# Patient Record
Sex: Female | Born: 1935 | Race: White | Hispanic: No | Marital: Married
Health system: Southern US, Community
[De-identification: ages and names within clinical notes are randomized; demographics above are authoritative.]

---

## 1997-08-15 ENCOUNTER — Inpatient Hospital Stay (HOSPITAL_COMMUNITY): Admission: RE | Admit: 1997-08-15 | Discharge: 1997-08-17 | Payer: Self-pay | Admitting: Orthopedic Surgery

## 1999-07-09 ENCOUNTER — Other Ambulatory Visit: Admission: RE | Admit: 1999-07-09 | Discharge: 1999-07-09 | Payer: Self-pay | Admitting: Cardiology

## 1999-12-24 ENCOUNTER — Ambulatory Visit (HOSPITAL_COMMUNITY): Admission: RE | Admit: 1999-12-24 | Discharge: 1999-12-24 | Payer: Self-pay | Admitting: Gastroenterology

## 2001-04-15 ENCOUNTER — Ambulatory Visit (HOSPITAL_COMMUNITY): Admission: RE | Admit: 2001-04-15 | Discharge: 2001-04-15 | Payer: Self-pay | Admitting: Specialist

## 2001-05-31 ENCOUNTER — Ambulatory Visit (HOSPITAL_COMMUNITY): Admission: RE | Admit: 2001-05-31 | Discharge: 2001-05-31 | Payer: Self-pay | Admitting: Specialist

## 2003-09-27 ENCOUNTER — Encounter: Admission: RE | Admit: 2003-09-27 | Discharge: 2003-11-28 | Payer: Self-pay | Admitting: Cardiology

## 2007-05-07 ENCOUNTER — Inpatient Hospital Stay (HOSPITAL_COMMUNITY): Admission: RE | Admit: 2007-05-07 | Discharge: 2007-05-11 | Payer: Self-pay | Admitting: Orthopedic Surgery

## 2008-02-29 ENCOUNTER — Encounter: Admission: RE | Admit: 2008-02-29 | Discharge: 2008-02-29 | Payer: Self-pay | Admitting: Orthopedic Surgery

## 2008-03-03 ENCOUNTER — Ambulatory Visit (HOSPITAL_COMMUNITY): Admission: RE | Admit: 2008-03-03 | Discharge: 2008-03-03 | Payer: Self-pay | Admitting: Orthopedic Surgery

## 2008-04-07 ENCOUNTER — Ambulatory Visit (HOSPITAL_COMMUNITY): Admission: RE | Admit: 2008-04-07 | Discharge: 2008-04-08 | Payer: Self-pay | Admitting: Orthopedic Surgery

## 2008-05-19 ENCOUNTER — Ambulatory Visit (HOSPITAL_COMMUNITY): Admission: RE | Admit: 2008-05-19 | Discharge: 2008-05-20 | Payer: Self-pay | Admitting: Orthopedic Surgery

## 2009-10-12 ENCOUNTER — Ambulatory Visit: Payer: Self-pay | Admitting: Cardiology

## 2009-10-16 ENCOUNTER — Ambulatory Visit: Payer: Self-pay | Admitting: Cardiology

## 2009-11-08 ENCOUNTER — Ambulatory Visit: Payer: Self-pay | Admitting: Cardiology

## 2010-04-11 ENCOUNTER — Other Ambulatory Visit (INDEPENDENT_AMBULATORY_CARE_PROVIDER_SITE_OTHER): Payer: Medicare Other

## 2010-04-11 DIAGNOSIS — Z79899 Other long term (current) drug therapy: Secondary | ICD-10-CM

## 2010-04-16 ENCOUNTER — Ambulatory Visit (INDEPENDENT_AMBULATORY_CARE_PROVIDER_SITE_OTHER): Payer: Medicare Other | Admitting: Cardiology

## 2010-04-16 DIAGNOSIS — I119 Hypertensive heart disease without heart failure: Secondary | ICD-10-CM

## 2010-04-16 DIAGNOSIS — E032 Hypothyroidism due to medicaments and other exogenous substances: Secondary | ICD-10-CM

## 2010-04-16 DIAGNOSIS — E782 Mixed hyperlipidemia: Secondary | ICD-10-CM

## 2010-05-15 LAB — CBC
HCT: 39.8 % (ref 36.0–46.0)
Hemoglobin: 11.7 g/dL — ABNORMAL LOW (ref 12.0–15.0)
Hemoglobin: 13.9 g/dL (ref 12.0–15.0)
RBC: 3.58 MIL/uL — ABNORMAL LOW (ref 3.87–5.11)
RBC: 4.3 MIL/uL (ref 3.87–5.11)
RDW: 13.5 % (ref 11.5–15.5)
RDW: 13.4 % (ref 11.5–15.5)

## 2010-05-15 LAB — ANAEROBIC CULTURE: Gram Stain: NONE SEEN

## 2010-05-15 LAB — URINALYSIS, ROUTINE W REFLEX MICROSCOPIC
Glucose, UA: NEGATIVE mg/dL
Specific Gravity, Urine: 1.007 (ref 1.005–1.030)
pH: 6 (ref 5.0–8.0)

## 2010-05-15 LAB — BASIC METABOLIC PANEL
Calcium: 9 mg/dL (ref 8.4–10.5)
GFR calc Af Amer: >60 mL/min (ref >60)
GFR calc non Af Amer: >60 mL/min (ref >60)
GFR calc non Af Amer: >60 mL/min (ref >60)
Glucose, Bld: 97 mg/dL (ref 70–99)
Potassium: 3.7 mEq/L (ref 3.5–5.1)
Sodium: 134 mEq/L — ABNORMAL LOW (ref 135–145)
Sodium: 134 mEq/L — ABNORMAL LOW (ref 135–145)

## 2010-05-15 LAB — TYPE AND SCREEN
ABO/RH(D): A POS
Antibody Screen: NEGATIVE

## 2010-05-15 LAB — TISSUE CULTURE: Culture: NO GROWTH

## 2010-05-15 LAB — DIFFERENTIAL
Basophils Absolute: 0 10*3/uL (ref 0.0–0.1)
Eosinophils Relative: 2 % (ref 0–5)
Lymphocytes Relative: 19 % (ref 12–46)
Lymphs Abs: 1.2 10*3/uL (ref 0.7–4.0)
Monocytes Absolute: 0.4 10*3/uL (ref 0.1–1.0)
Monocytes Relative: 6 % (ref 3–12)

## 2010-05-15 LAB — ABO/RH: ABO/RH(D): A POS

## 2010-05-16 LAB — BASIC METABOLIC PANEL
BUN: 15 mg/dL (ref 6–23)
CO2: 26 mEq/L (ref 19–32)
Calcium: 8.8 mg/dL (ref 8.4–10.5)
Chloride: 104 mEq/L (ref 96–112)
Creatinine, Ser: 0.72 mg/dL (ref 0.4–1.2)
GFR calc Af Amer: >60 mL/min (ref >60)
GFR calc non Af Amer: >60 mL/min (ref >60)
Glucose, Bld: 156 mg/dL — ABNORMAL HIGH (ref 70–99)
Glucose, Bld: 163 mg/dL — ABNORMAL HIGH (ref 70–99)
Potassium: 3.8 mEq/L (ref 3.5–5.1)
Sodium: 137 mEq/L (ref 135–145)

## 2010-05-16 LAB — DIFFERENTIAL
Basophils Relative: 0 % (ref 0–1)
Eosinophils Relative: 3 % (ref 0–5)
Monocytes Absolute: 0.3 10*3/uL (ref 0.1–1.0)
Monocytes Relative: 7 % (ref 3–12)
Neutro Abs: 2.9 10*3/uL (ref 1.7–7.7)
Neutrophils Relative %: 64 % (ref 43–77)

## 2010-05-16 LAB — URINALYSIS, ROUTINE W REFLEX MICROSCOPIC
Bilirubin Urine: NEGATIVE
Hgb urine dipstick: NEGATIVE
Specific Gravity, Urine: 1.011 (ref 1.005–1.030)
Urobilinogen, UA: 0.2 mg/dL (ref 0.0–1.0)
pH: 6.5 (ref 5.0–8.0)

## 2010-05-16 LAB — CBC
HCT: 34.5 % — ABNORMAL LOW (ref 36.0–46.0)
Hemoglobin: 11.6 g/dL — ABNORMAL LOW (ref 12.0–15.0)
MCHC: 33.6 g/dL (ref 30.0–36.0)
Platelets: 233 10*3/uL (ref 150–400)
RBC: 3.63 MIL/uL — ABNORMAL LOW (ref 3.87–5.11)
RDW: 13.2 % (ref 11.5–15.5)
RDW: 13.6 % (ref 11.5–15.5)
WBC: 4.6 10*3/uL (ref 4.0–10.5)

## 2010-05-16 LAB — PROTIME-INR: Prothrombin Time: 12.9 seconds (ref 11.6–15.2)

## 2010-05-17 ENCOUNTER — Telehealth: Payer: Self-pay | Admitting: Cardiology

## 2010-06-03 ENCOUNTER — Other Ambulatory Visit: Payer: Self-pay | Admitting: *Deleted

## 2010-06-03 DIAGNOSIS — E039 Hypothyroidism, unspecified: Secondary | ICD-10-CM

## 2010-06-03 DIAGNOSIS — E7800 Pure hypercholesterolemia, unspecified: Secondary | ICD-10-CM

## 2010-06-10 ENCOUNTER — Telehealth: Payer: Self-pay | Admitting: Cardiology

## 2010-06-10 ENCOUNTER — Encounter (INDEPENDENT_AMBULATORY_CARE_PROVIDER_SITE_OTHER): Payer: Medicare Other

## 2010-06-10 DIAGNOSIS — M7989 Other specified soft tissue disorders: Secondary | ICD-10-CM

## 2010-06-10 DIAGNOSIS — M79609 Pain in unspecified limb: Secondary | ICD-10-CM

## 2010-06-10 NOTE — Telephone Encounter (Signed)
Pt has swollen knee and ankle she wants a meds to help with swelling please call

## 2010-06-10 NOTE — Telephone Encounter (Signed)
Patient just drove to beach and back.  Having left knee, leg, and ankle swelling.  Asked if any pain, stated she had pain behind her knee and tightness in her calf.  Scheduled doppler to rule out dvt today, ok per Lawson Fiscal.

## 2010-06-11 ENCOUNTER — Telehealth: Payer: Self-pay | Admitting: *Deleted

## 2010-06-11 NOTE — Telephone Encounter (Signed)
Received a call report yesterday on doppler and it was negative.  Advised patient to try support stockings, elevate legs, and avoid salt per Lindsay Gibson.  Call back if swelling continues.

## 2010-06-13 NOTE — Procedures (Unsigned)
DUPLEX DEEP VENOUS EXAM - LOWER EXTREMITY  INDICATION:  Left lower extremity pain and edema.  HISTORY:  Edema:  Yes. Trauma/Surgery:  Right knee replacement. Pain:  Yes. PE:  No. Previous DVT:  No. Anticoagulants:  Patient takes 1 baby aspirin a day. Other:  DUPLEX EXAM:               CFV   SFV   PopV  PTV    GSV               R  L  R  L  R  L  R   L  R  L Thrombosis    o  o     o     o      o     o Spontaneous   +  +     +     +      +     + Phasic        +  +     +     +      +     + Augmentation  +  +     +     +      +     + Compressible  +  +     +     +      +     + Competent     +  +     +     +      +     +  Legend:  + - yes  o - no  p - partial  D - decreased  IMPRESSION:    1. No evidence of deep or superficial thrombophlebitis identified in       the left lower extremity.    2. Hypoechoic area present in the left popliteal fossa which does not       appear vascular in nature.    3. Preliminary report given to [facility] @ [facility]  office.   _____________________________ Di Kindle Edilia Bo, M.D.  SH/MEDQ  D:  06/10/2010  T:  06/10/2010  Job:  [id number]

## 2010-06-18 ENCOUNTER — Other Ambulatory Visit (INDEPENDENT_AMBULATORY_CARE_PROVIDER_SITE_OTHER): Payer: Medicare Other | Admitting: *Deleted

## 2010-06-18 DIAGNOSIS — E7800 Pure hypercholesterolemia, unspecified: Secondary | ICD-10-CM

## 2010-06-18 DIAGNOSIS — E039 Hypothyroidism, unspecified: Secondary | ICD-10-CM

## 2010-06-18 LAB — TSH: TSH: 2.93 u[IU]/mL (ref 0.35–5.50)

## 2010-06-18 LAB — LIPID PANEL
Total CHOL/HDL Ratio: 3
Triglycerides: 57 mg/dL (ref 0.0–149.0)

## 2010-06-18 LAB — HEPATIC FUNCTION PANEL
Bilirubin, Direct: 0.1 mg/dL (ref 0.0–0.3)
Total Bilirubin: 0.8 mg/dL (ref 0.3–1.2)

## 2010-06-18 LAB — BASIC METABOLIC PANEL
CO2: 30 mEq/L (ref 19–32)
Calcium: 9.6 mg/dL (ref 8.4–10.5)
GFR: 97.6 mL/min (ref >60.00)
Potassium: 4.8 mEq/L (ref 3.5–5.1)
Sodium: 140 mEq/L (ref 135–145)

## 2010-06-18 LAB — LDL CHOLESTEROL, DIRECT: Direct LDL: 141.2 mg/dL

## 2010-06-18 NOTE — Op Note (Signed)
Lindsay Gibson, Lindsay Gibson                  ACCOUNT NO.:  [id number]   MEDICAL RECORD NO.:  [id number]          PATIENT TYPE:  INP   LOCATION:  NA                           FACILITY:  [facility]   PHYSICIAN:  Almedia Balls. Ranell Patrick, M.D. DATE OF BIRTH:  12-19-1924   DATE OF PROCEDURE:  05/07/2007  DATE OF DISCHARGE:                               OPERATIVE REPORT   PREOPERATIVE DIAGNOSIS:  Right knee end-stage arthritis.   POSTOPERATIVE DIAGNOSIS:  Right knee end-stage arthritis.   PROCEDURE PERFORMED:  Right total knee replacement using DePuy system.   ATTENDING SURGEON:  Almedia Balls. Ranell Patrick, M.D.   ASSISTANT:  Konrad Felix Dixon, [place]   ANESTHESIA:  General anesthesia plus interscalene block anesthesia was  used.   ESTIMATED BLOOD LOSS:  Minimal.   FLUID REPLACEMENT:  1200 mL of crystalloid.   INSTRUMENT COUNTS:  Correct.   COMPLICATIONS:  None.   FLUID REPLACEMENT:  2200 mL of crystalloid.   URINE OUTPUT:  325 mL.   INDICATIONS:  The patient is an 77 year old female with a history of  worsening right knee pain.  The patient has known arthritis and has  failed an extensive period of conservative management and desires  operative treatment at this point to restore function and eliminate  pain.  Informed consent was obtained.   DESCRIPTION OF PROCEDURE:  After an adequate level of anesthesia was  achieved the patient was positioned supine on the operating room table.  The right leg was correctly identified.  Nonsterile tourniquet was  placed on the proximal thigh.  The left leg was placed on the table and  was padded appropriately and secured.  After sterile prep and drape of  the right knee we elevated the knee and exsanguinated the leg using the  Esmarch bandage.  The tourniquet was elevated to 300 mmHg. A  longitudinal midline skin incision was created with the knee in flexion.  Dissection was carried sharply down through subcutaneous tissues. We  identified the parapatellar retinaculum.  We went ahead and divided that  medially performing an arthrotomy and then everting the patella and  exposing the distal femur.   We entered the distal femur with a step-cut drill and placed the  intramedullary distal femoral resection guide resecting 10 mm at 5  degree right setting.  At this point we went ahead and sized the femur  to a size 3.  We performed our anterior, posterior and chamfer cuts.  We  then removed the ACL, PCL and meniscal tissue, subluxed the tibia  anteriorly and then performed our tibial cut 90 degrees perpendicular to  the long axis of the tibia, 6 mm off the affected side laterally.   We then sized and prepared the tibia.  It was a size 3.  Went ahead at  this point and placed our trial components after completing our  preparation of the tibia and completing our box cut on the femur.  We  reduced the knee with first a 10 and then a 12.5 insert and had  excellent stability and full range of motion with the 12.5.  We then  resurfaced the patella going from a  23 mm thickness down to 15.  It was  a size 35 patella.  We made our lug holes and then placed our trial  button on and took the knee through a full range of motion.  Excellent  patellar tracking was noted and good instability. We thoroughly  irrigated the knee, removed the trial components, placed bone in the  distal femur to plug the end of the hole from the intramedullary guide.  We then using third generation cement technique cemented the components  into place, allowed the cement to harden, removed excess cement with  quarter inch curved osteotome. We removed the trial insert and then  placed the real 12.5 insert. We took the knee through a full range of  motion again. There was good soft tissue balance and normal tracking of  the patella.   We then closed the parapatellar arthrotomy with interrupted #1 PDS  suture in a figure-of-eight fashion followed by 2-0 Vicryl for  subcutaneous closure and  running 4-0 Monocryl for skin.  Steri-Strips  were applied followed by a sterile dressing.  The patient tolerated the  procedure well.      Almedia Balls. Ranell Patrick, M.D.  Electronically Signed     SRN/MEDQ  D:  05/07/2007  T:  05/07/2007  Job:  [id number]

## 2010-06-18 NOTE — Op Note (Signed)
NAMETRANESHA, LESSNER NO.:  [id number]   MEDICAL RECORD NO.:  [id number]          PATIENT TYPE:  INP   LOCATION:  5023                         FACILITY:  [facility]   PHYSICIAN:  Almedia Balls. Ranell Patrick, M.D. 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   PROCEDURE PERFORMED:  Right shoulder revision arthroplasty with bone  grafting of glenoid defect.   SURGEON:  Almedia Balls. Ranell Patrick, MD   ASSISTANT:  Donnie Coffin. Dixon, PA-C   ANESTHESIA:  General anesthesia plus interscalene block anesthesia was  used.   ESTIMATED BLOOD LOSS:  Minimal.   FLUID REPLACEMENT:  1500 mL of crystalloid.   URINE OUTPUT:  200 mL.   INSTRUMENT COUNT:  Correct.   COMPLICATIONS:  None.   Perioperative antibiotics were given.   INDICATIONS:  The patient is an 77 year old female with a history of a  total shoulder replacement done at [place] a decade ago.  The patient has  done fairly well until the last 6-9 months where she has had worsening  pain in the shoulder.  She presents with radiographic evidence of a  loose glenoid component.  I discussed this with the patient regarding  treatment options including continued conservative treatment versus  surgical management.  The patient elected to proceed with surgery to try  to relieve the pain and restore function to the shoulder.  Informed  consent was obtained.   DESCRIPTION OF THE PROCEDURE:  After an adequate level of anesthesia was  achieved, the patient was positioned in the modified beach-chair  position.  Right shoulder examined under anesthesia.  The patient did  have some stiffness in the shoulder, forward elevation limited about  100, abduction  70, external rotation about 30, internal rotation  posterior over to the abdomen.  After examining the shoulder, I went  ahead and sterilely prepped and draped the shoulder in the usual manner.  We approached the shoulder through the patient's prior deltopectoral  approach dissection down through subcutaneous tissues.  The  deltopectoral interval identified starting at the coracoid and extending  distally.  We freed up that interval and the subdeltoid interval using a  Cobb elevator and a Bovie.  We identified the pectoralis and the  interval between the pectoralis and the coracoid process in the  conjoined tendon, gently mobilized that tissue, placed our self-  retaining retractors and then mobilized the conjoined tendon off the  subscapularis.  We were careful to identify the axillary nerve and  protected during the procedure.  We then released the subscapularis.  This was quite a robust tendon and released it from the rotator interval  and roughly at the bicipital groove.  I replaced two #2 FiberWire  sutures in a modified ONEOK suture technique into the tendon.  We  then went ahead and released the soft  tissues off the humeral neck  progressively externally rotating until we could deliver the humeral  head.  We then removed the humeral head using a tuning fork type wedge.  There was extensive soft tissue scarring all around the subdeltoid  interval also around the subscapularis and up in the rotator interval  area.  We had all that freed up until it could move quite well.  Once we  had released the subscapularis, we released the capsular tap, and there  was quite a bit of synovitis in joint which we removed sharply.  With  the head off, there was a lot of soft tissue ingrowth under the head and  between the stem and the head.  Stem seemed to be well fixed.  We pushed  on that and was not moving at all.  At this point, we did a  circumferential release around the glenoid.  The  glenoid was loose, we  removed that and had cement still attached to the skin.  There was a  large bony defect present in the glenoid vault.  We used a curette to  curette out all the soft tissue.  This was a contained defect and we  used a bur to bur down some of the hard sclerotic bone.  We had the  entire glenoid vault bleeding at which point we took 20 mL of cancellous  allograft and mixed that with the patient's blood and impacted that into  the defect.  We used a glenoid impactor to impact and compact the  cancellous bone until it basically reconstituted the glenoid.  Once that  was filled and compact, we went ahead and placed a fresh 44 x 27 Biomet  Bio-Modular head in place and impacted that.  We reduced the shoulder.  We were happy with soft tissue balancing and then went ahead and  thoroughly irrigated and then closed the subscapularis back into its  anatomic position with two #2 FiberWire sutures in a figure-of-eight  fashion in the rotator interval and about 4-5 FiberWires including the  Mason-Allen sutures sewing back out laterally.  We took the shoulder  through a range of motion, no instability was detected.  We thoroughly  irrigated and closed the deltopectoral interval with interrupted  0  Vicryl followed by 2-0 Vicryl subcutaneous closure and 4-0 Monocryl for  skin.  Steri-Strips applied followed by sterile dressing.      Almedia Balls. Ranell Patrick, M.D.  Electronically Signed     SRN/MEDQ  D:  05/19/2008  T:  05/20/2008  Job:  [id number]

## 2010-06-18 NOTE — H&P (Signed)
NAMELUNDON, ROSIER                  ACCOUNT NO.:  [id number]   MEDICAL RECORD NO.:  [id number]          PATIENT TYPE:  INP   LOCATION:  NA                           FACILITY:  [facility]   PHYSICIAN:  Almedia Balls. Ranell Patrick, M.D. DATE OF BIRTH:  Aug 13, 1924   DATE OF ADMISSION:  DATE OF DISCHARGE:                              HISTORY & PHYSICAL   CHIEF COMPLAINT:  Right shoulder pain, status post failed total shoulder  arthroplasty.   HISTORY OF PRESENT ILLNESS:  The patient is an 77 year old female who  was complaining of worsening right shoulder pain, status post right  total shoulder arthroplasty.  Films were obtained showing that she does  have a loose glenoid component on that right shoulder that she is  elected to have a revision by Dr. Malon Kindle.   PAST MEDICAL HISTORY:  Hypertension, history of breast cancer with  mastectomy on the right.   FAMILY MEDICAL HISTORY:  Negative.   SOCIAL HISTORY:  The patient does not smoke or drink.  The patient of  Dr. Patty Sermons.   DRUG ALLERGIES:  None.   CURRENT MEDICATIONS:  1. Hyzaar 100/25 mg p.o. daily.  2. Actonel 1 tablet monthly.  3. Aspirin 81 mg p.o. daily.  4. Lipitor 20 mg p.o. daily.  5. Celebrex 200 mg daily.  6. Amlodipine 5 mg daily.  7. Norco 5/325, one to two tablets q.4-6 h. p.r.n. pain.   REVIEW OF SYSTEMS:  Pain to range of motion of the left shoulder.   PHYSICAL EXAMINATION:  VITAL SIGNS:  Pulse 68, respirations 18, blood  pressure 110/68.  GENERAL:  The patient is a healthy-appearing 77 year old female, in no  acute distress.  Pleasant mood and affect.  Oriented x3.  HEAD AND NECK:  Full range of motion without tenderness.  Neck shows  cranial nerves II through XII grossly intact.  CHEST:  Active breath sounds bilaterally.  No wheezes, rhonchi, or  rales.  ABDOMEN:  Nontender, nondistended.  EXTREMITIES:  Moderate tenderness with decreased range of motion of the  right shoulder.  Forward flexion at 80 degrees,  external rotation 20  degrees, internal rotation to abdomen.  Capillary refill less than 2  seconds.  NEUROVASCULAR:  Otherwise intact.   X-ray shows right shoulder arthroplasty using a Biomet arthroplasty  system with a loose glenoid component.   IMPRESSION:  Right shoulder arthroplasty with loose glenoid.   PLAN OF ACTION:  Right total shoulder revision by Dr. Malon Kindle.      Thomas B. Durwin Nora, P.A.      Almedia Balls. Ranell Patrick, M.D.  Electronically Signed    TBD/MEDQ  D:  05/10/2008  T:  05/11/2008  Job:  [id number]

## 2010-06-18 NOTE — H&P (Signed)
NAMERANDOLPH, PAONE                  ACCOUNT NO.:  [id number]   MEDICAL RECORD NO.:  [id number]          PATIENT TYPE:  INP   LOCATION:  NA                           FACILITY:  [facility]   PHYSICIAN:  Almedia Balls. Ranell Patrick, M.D. DATE OF BIRTH:  [birth year]   DATE OF ADMISSION:  DATE OF DISCHARGE:                              HISTORY & PHYSICAL   CHIEF COMPLAINTS:  Right knee pain.   HISTORY OF PRESENT ILLNESS:  Patient has been having worsening right  knee pain that has been refractory to conservative treatment.  Patient  has elected have a right total knee arthroplasty by Dr. Malon Kindle.   PAST MEDICAL HISTORY:  1. History of breast cancer and inability to have any type of blood      pressures or lab draws from the right arm due to a right      mastectomy.  2. History of hypertension.   FAMILY HISTORY:  Hypertension.   SOCIAL HISTORY:  Occasional alcohol use.  She does not smoke.  Patient  of Dr. Patty Sermons.  She is retired.   ALLERGIES:  None.   MEDICATIONS:  1. Aspirin 81 mg p.o. daily.  2. Multivitamin daily.  3. Lipitor 20 mg p.o. daily.  4. Hyzaar 100/25 p.o. daily.  5. Celebrex 200 mg p.o. daily.  6. Actonel 150 mg once a month.  7. Calcium D 500 mg b.i.d.   REVIEW OF SYSTEMS:  Pain with ambulation.   PHYSICAL EXAMINATION:  VITAL SIGNS:  pulse 80, respirations 18, blood  pressure 144/82.  GENERAL:  The patient is a healthy-appearing 77 year old female in no  acute distress.  Pleasant mood and affect, alert and oriented x3.  Cranial nerves II-XII grossly intact.  She has full range of motion of  cervical, thoracic and lumbar spines with no tenderness to palpation to  paraspinal muscle and spinous processes.  CHEST:  Active breath sounds bilaterally with no wheezes, rhonchi or  rales.  HEART:  Shows regular rate and rhythm.  No murmur.  ABDOMEN:  Nontender, nondistended with active bowel sounds.  PELVIS:  Stable.  EXTREMITIES:  Shows moderate tenderness to the right  knees, especially  in the medial joint line with range of motion.  NEUROVASCULAR:  She is intact.  No rashes or edema distally.   STUDIES:  X-rays show end-stage osteoarthritis to the right knee.   IMPRESSION:  End-stage osteoarthritis right knee.   PLAN:  Have a right total knee arthroplasty by Dr. Malon Kindle.      Thomas B. Durwin Nora, P.A.      Almedia Balls. Ranell Patrick, M.D.  Electronically Signed    TBD/MEDQ  D:  04/27/2007  T:  04/27/2007  Job:  [id number]

## 2010-06-18 NOTE — Op Note (Signed)
[name], Lindsay Gibson                  ACCOUNT NO.:  [id number]   MEDICAL RECORD NO.:  [id number]          PATIENT TYPE:  OIB   LOCATION:  1620                         FACILITY:  [facility]   PHYSICIAN:  Madlyn Frankel. Charlann Boxer, M.D.  DATE OF BIRTH:  1925-01-28   DATE OF PROCEDURE:  04/07/2008  DATE OF DISCHARGE:                               OPERATIVE REPORT   PREOPERATIVE DIAGNOSIS:  Right knee patella clinically in excessive  synovial and scar in the suprapatellar pouch region, resulting in  mechanical symptoms that resulted in symptomatic knee pain in an 77-year-  old female, otherwise successful total knee replacement.   POSTOPERATIVE DIAGNOSIS:  Right knee patella clinically in excessive  synovial and scar in the suprapatellar pouch region, resulting in  mechanical symptoms that resulted in symptomatic knee pain in an 54-year-  old female, otherwise successful total knee replacement.   PROCEDURE:  Open right knee synovectomy and scar debridement, otherwise  incision and drainage of the knee.  No polyethylene exchange.   SURGEON:  Madlyn Frankel. Charlann Boxer, M.D.   ASSISTANT:  Surgical tech.   ANESTHESIA:  General with placement of a preoperative femoral nerve  block.   FINDINGS:  Consistent with suprapatellar clunk with a particular large 1  x 2 cm area of the excessive synovium and scar just above the patella.  tracked otherwise through the trochlea .   SPECIMENS:  None.   COMPLICATIONS:  None.   DRAINS:  None.   TOURNIQUET TIME:  Thirty-three minutes at 250 mmHg.   INDICATIONS FOR PROCEDURE:  Lindsay Gibson is an 77 year old female referred  over for evaluation and symptomatic crepitation in the knee,  particularly coming from the flexed to extended position.  She had a  workup that was negative for infection or loosening.  I reviewed with  her the mechanical findings on exam and surgical recommendations.  The  risks of recurrence, infection due to repeated surgery within a year  after indexed  total knee replacement, were all discussed in addition to  the standard risks of the DVT and knee replacement issues.  Consent was  obtained for the benefit of relief of her symptoms.   PROCEDURE IN DETAIL:  Patient was brought to the operating theater.  Once adequate anesthesia, preoperative antibiotics, Ancef, administered,  patient was positioned supine.  The thigh tourniquet was placed on the  right thigh.  The right lower extremity was then pre-scrubbed, prepped  and draped in a sterile fashion.  A time-out was performed, identifying  the patient, the planned procedure, and extremely.  The leg was  exsanguinated.  The tourniquet elevated to 250 mmHg.  The patient's old  incision was incised, and the knee opened.  Supracapsular dissection was  carried out, identifying the tissue planes.  I then incised the joint  capsule through a medial arthrotomy.  Once the knee was opened, the  synovial fluid was a bit blood-tinged but was normal, otherwise no signs  of any purulence.  At this point, 2 Kocher clamps were placed along the  media and retinacular tissue and subsequently performed a synovectomy  along this medial gutter, using the #10 knife blade.  I then switched  the Kochers to the lateral retinacular tissue and the majority of the  quadriceps tendon and did the same dissection, removing all of the  synovium that was scarred in the suprapatellar region, which we noted to  be quite significant.  I then removed more synovium through the lateral  gutter and in the infrapatellar region.  Following this, adequate  debridement of synovium, we cauterized the synovial tissue.  I then  injected the knee with 0.25% Marcaine with epinephrine.  I then injected  the knee with about 500 cc of normal saline solution.  No Hemovac drain  was placed.  I then reapproximated the extensor mechanism with the knee  in flexion using #1 Vicryl.  The remaining wound was closed with 2-0  Vicryl and a 4-0  running Monocryl on the skin.  The skin was cleaned,  dried, and dressed sterilely after the tourniquet was let down at 33  minutes.  Steri-Strips were applied.  A bulky sterile wrap was applied.  Patient was brought to the recovery room in stable condition, extubated  in stable condition, tolerating the procedure well.      Madlyn Frankel Charlann Boxer, M.D.  Electronically Signed     MDO/MEDQ  D:  04/07/2008  T:  04/07/2008  Job:  [id number]

## 2010-06-21 NOTE — Discharge Summary (Signed)
Lindsay Gibson, Lindsay Gibson                  ACCOUNT NO.:  [id number]   MEDICAL RECORD NO.:  [id number]          PATIENT TYPE:  INP   LOCATION:  1512                         FACILITY:  [facility]   PHYSICIAN:  Almedia Balls. Ranell Patrick, M.D. DATE OF BIRTH:  [birth year]   DATE OF ADMISSION:  05/07/2007  DATE OF DISCHARGE:  05/11/2007                               DISCHARGE SUMMARY   ADMISSION DIAGNOSIS:  Right knee osteoarthritis, end-stage.   DISCHARGE DIAGNOSES:  Right knee osteoarthritis, end-stage, status post  total knee arthroplasty.   BRIEF HISTORY:  The patient is an 77 year old female with worsening  right knee pain.  The pain has been refractory to conservative  treatment.  The patient elected to have a total knee arthroplasty.   PROCEDURE:  The patient had a right total knee arthroplasty by Dr.  Malon Kindle on May 07, 2007.   ASSISTANT:  Standley Dakins PA-C.   Minimal blood loss.  No complications.   HOSPITAL COURSE:  The patient admitted on May 07, 2007, for the above-  stated procedure, which she tolerated well.  After adequate time in the  post anesthesia care unit, she was transferred up to [address].  The  patient complained of some moderate pain when she was ambulating in that  right knee on postop day #1, but otherwise was okay.  Her dressing was  intact.  Neurovascularly, she was intact.  Labs were within acceptable  limits.  No signs of cellulitis, erythema or infection on dressing  changes.  On postop day 3-4, home health was set up through [place].  She did have a slow rising INR, and this patient was discharged home on  Lovenox.  Also, she was instructed and her family was instructed on how  to perform Lovenox injections before leaving the hospital.  Overall, she  did quite well with physical therapy.   DISCHARGE/PLAN:  The patient will be discharged home on May 11, 2007.   CONDITION:  Stable.   DIET:  Regular.   DISCHARGE MEDICATIONS:  1. Lovenox 30 mg subcu b.i.d.  2.  Coumadin per pharmacy protocol.  3. Percocet 5/325 one-to-two tabs q.4-6 hours p.r.n. pain.  4. Robaxin 500 mg p.o. q.6 h.  5. Multivitamin.  6. Lipitor 20 mg p.o. daily.  7. Hyzaar 100/25 p.o. daily.  8. Actonel 150 mg once a month.  9. Calcium 500 mg b.i.d.  10.Celebrex 200 mg daily p.r.n.   FOLLOW UP:  The patient will follow back up with Dr. Malon Kindle in 2  weeks.      Thomas B. Durwin Nora, P.A.      Almedia Balls. Ranell Patrick, M.D.  Electronically Signed    TBD/MEDQ  D:  05/28/2007  T:  05/28/2007  Job:  [id number]

## 2010-06-21 NOTE — Consult Note (Signed)
NAME:  DENELLE, CAPURRO NO.:  [id number]   MEDICAL RECORD NO.:  [id number]           PATIENT TYPE:   LOCATION:                               FACILITY:  [facility]   PHYSICIAN:  Sheldon Silvan, M.D.      DATE OF BIRTH:  1924/07/30   DATE OF CONSULTATION:  07/18/2008  DATE OF DISCHARGE:                                 CONSULTATION   This is basically a note conforming a telephone conversation with the  patient that I had today.   The patient had sent a letter to my office requesting her medical  records for surgeries done on March 5 and April 16.  On further  research, the March 5, surgery was done at [facility],  however, the May 19, 2008, surgery was done here and I was the  Anesthesiologist.   She was riding to my office saying that she had had difficulty with a  sore spot in the roof of her mouth after intubation.  She had a  difficult airway that we discovered during the procedure and a glide  scope was used.  There was no trauma noted at that time.  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 Bradly Chris.   She was not overly concerned, but it is still being treated for this  with antibiotics.  She was grateful for the telephone call, and I will  follow up with her as needed in the future.      Sheldon Silvan, M.D.     DC/MEDQ  D:  07/18/2008  T:  07/18/2008  Job:  [id number]

## 2010-06-24 ENCOUNTER — Other Ambulatory Visit: Payer: Self-pay | Admitting: *Deleted

## 2010-06-24 ENCOUNTER — Telehealth: Payer: Self-pay | Admitting: *Deleted

## 2010-06-24 DIAGNOSIS — I10 Essential (primary) hypertension: Secondary | ICD-10-CM

## 2010-06-24 MED ORDER — AMLODIPINE BESYLATE 10 MG PO TABS: 10.0000 mg | ORAL_TABLET | Freq: Every day | ORAL | Status: AC

## 2010-06-24 NOTE — Telephone Encounter (Signed)
Refilled amlodipine by fax

## 2010-06-24 NOTE — Telephone Encounter (Signed)
Adv. Pt. Of lab results samples, given of crestor 10mg  to take 1/2 daily . Pt. Will call in August to sch. Lab ahead of time for appt in Sept.

## 2010-07-24 ENCOUNTER — Ambulatory Visit
Admission: RE | Admit: 2010-07-24 | Discharge: 2010-07-24 | Disposition: A | Discharge status: Home / Self Care | Payer: Medicare Other | Source: Ambulatory Visit | Attending: Orthopedic Surgery | Admitting: Orthopedic Surgery

## 2010-07-24 ENCOUNTER — Other Ambulatory Visit: Payer: Self-pay | Admitting: Orthopedic Surgery

## 2010-07-24 DIAGNOSIS — M25511 Pain in right shoulder: Secondary | ICD-10-CM

## 2010-08-13 ENCOUNTER — Ambulatory Visit (HOSPITAL_COMMUNITY): Payer: Medicare Other

## 2010-08-13 ENCOUNTER — Inpatient Hospital Stay (HOSPITAL_COMMUNITY)
Admission: RE | Admit: 2010-08-13 | Discharge: 2010-08-16 | DRG: 507 | Disposition: A | Discharge status: Home Health | Payer: Medicare Other | Source: Ambulatory Visit | Attending: Orthopedic Surgery | Admitting: Orthopedic Surgery

## 2010-08-13 DIAGNOSIS — Z96659 Presence of unspecified artificial knee joint: Secondary | ICD-10-CM

## 2010-08-13 DIAGNOSIS — A4901 Methicillin susceptible Staphylococcus aureus infection, unspecified site: Secondary | ICD-10-CM | POA: Diagnosis present

## 2010-08-13 DIAGNOSIS — I10 Essential (primary) hypertension: Secondary | ICD-10-CM | POA: Diagnosis present

## 2010-08-13 DIAGNOSIS — IMO0002 Reserved for concepts with insufficient information to code with codable children: Secondary | ICD-10-CM | POA: Diagnosis present

## 2010-08-13 DIAGNOSIS — D649 Anemia, unspecified: Secondary | ICD-10-CM | POA: Diagnosis not present

## 2010-08-13 DIAGNOSIS — E039 Hypothyroidism, unspecified: Secondary | ICD-10-CM | POA: Diagnosis present

## 2010-08-13 DIAGNOSIS — Z7982 Long term (current) use of aspirin: Secondary | ICD-10-CM

## 2010-08-13 DIAGNOSIS — Z96619 Presence of unspecified artificial shoulder joint: Secondary | ICD-10-CM

## 2010-08-13 DIAGNOSIS — T8450XA Infection and inflammatory reaction due to unspecified internal joint prosthesis, initial encounter: Principal | ICD-10-CM | POA: Diagnosis present

## 2010-08-13 DIAGNOSIS — Z79899 Other long term (current) drug therapy: Secondary | ICD-10-CM

## 2010-08-13 DIAGNOSIS — Z853 Personal history of malignant neoplasm of breast: Secondary | ICD-10-CM

## 2010-08-13 DIAGNOSIS — Z01811 Encounter for preprocedural respiratory examination: Secondary | ICD-10-CM

## 2010-08-13 DIAGNOSIS — Y831 Surgical operation with implant of artificial internal device as the cause of abnormal reaction of the patient, or of later complication, without mention of misadventure at the time of the procedure: Secondary | ICD-10-CM | POA: Diagnosis present

## 2010-08-13 LAB — BASIC METABOLIC PANEL
Chloride: 98 mEq/L (ref 96–112)
GFR calc Af Amer: >60 mL/min (ref >60)
Potassium: 4.5 mEq/L (ref 3.5–5.1)
Sodium: 136 mEq/L (ref 135–145)

## 2010-08-13 LAB — PROTIME-INR: INR: 0.97 (ref 0.00–1.49)

## 2010-08-13 LAB — CBC
HCT: 39 % (ref 36.0–46.0)
Hemoglobin: 13.3 g/dL (ref 12.0–15.0)
MCV: 91.1 fL (ref 78.0–100.0)
WBC: 8 10*3/uL (ref 4.0–10.5)

## 2010-08-13 LAB — APTT: aPTT: 35 seconds (ref 24–37)

## 2010-08-13 LAB — SURGICAL PCR SCREEN
MRSA, PCR: NEGATIVE
Staphylococcus aureus: POSITIVE — AB

## 2010-08-13 LAB — GRAM STAIN

## 2010-08-14 ENCOUNTER — Inpatient Hospital Stay (HOSPITAL_COMMUNITY): Payer: Medicare Other

## 2010-08-14 DIAGNOSIS — T8450XA Infection and inflammatory reaction due to unspecified internal joint prosthesis, initial encounter: Secondary | ICD-10-CM

## 2010-08-14 DIAGNOSIS — Z96619 Presence of unspecified artificial shoulder joint: Secondary | ICD-10-CM

## 2010-08-14 DIAGNOSIS — Y831 Surgical operation with implant of artificial internal device as the cause of abnormal reaction of the patient, or of later complication, without mention of misadventure at the time of the procedure: Secondary | ICD-10-CM

## 2010-08-14 LAB — BASIC METABOLIC PANEL
CO2: 27 mEq/L (ref 19–32)
Calcium: 8.9 mg/dL (ref 8.4–10.5)
Creatinine, Ser: <0.47 mg/dL — ABNORMAL LOW (ref 0.50–1.10)

## 2010-08-14 LAB — CBC
MCH: 30.3 pg (ref 26.0–34.0)
MCV: 91.8 fL (ref 78.0–100.0)
Platelets: 302 10*3/uL (ref 150–400)
RBC: 3.66 MIL/uL — ABNORMAL LOW (ref 3.87–5.11)

## 2010-08-14 LAB — SEDIMENTATION RATE: Sed Rate: 75 mm/hr — ABNORMAL HIGH (ref 0–22)

## 2010-08-15 DIAGNOSIS — Z96619 Presence of unspecified artificial shoulder joint: Secondary | ICD-10-CM

## 2010-08-15 DIAGNOSIS — T8450XA Infection and inflammatory reaction due to unspecified internal joint prosthesis, initial encounter: Secondary | ICD-10-CM

## 2010-08-16 LAB — CULTURE, ROUTINE-ABSCESS

## 2010-08-18 LAB — ANAEROBIC CULTURE

## 2010-08-23 NOTE — Op Note (Signed)
NAMEEMMAJO, BENNETTE NO.:  [id number]  MEDICAL RECORD NO.:  [id number]  LOCATION:  5033                         FACILITY:  [facility]  PHYSICIAN:  Almedia Balls. Ranell Patrick, M.D. DATE OF BIRTH:  1924-06-05  DATE OF PROCEDURE:  08/13/2010 DATE OF DISCHARGE:                              OPERATIVE REPORT   PREOPERATIVE DIAGNOSIS:  Right shoulder infection.  POSTOPERATIVE DIAGNOSIS:  Right shoulder infection.  PROCEDURE PERFORMED:  Incision and drainage, deep right shoulder including placement of drain.  ATTENDING SURGEON:  Almedia Balls. Ranell Patrick, MD  ASSISTANT:  None.  ANESTHESIA:  General anesthesia was used.  ESTIMATED BLOOD LOSS:  100 mL.  FLUID REPLACEMENT:  1000 mL of crystalloid.  INSTRUMENT COUNTS:  Correct.  COMPLICATIONS:  There were no complications.  Perioperative antibiotics were given after cultures were obtained.  INDICATIONS:  The patient is an 77 year old female who is status post multiple surgeries to her right shoulder including prior total shoulder arthroplasty.  The patient has also gone onto develop a loose glenoid component several years ago requiring removal of the glenoid component and bone grafting of her shoulder.  The patient did well following that surgery and recently was seen after a fall to the shoulder, basically landed on her shoulder jamming her elbow up into her shoulder. Complaining of severe pain in the shoulder after that fall.  There was no evidence of any infection at that time.  The patient did report no problems with the shoulder before this fall.  She was referred for a CT arthrogram for the shoulder to rule out rotator cuff tear. Unfortunately over the last 72 hours, the patient has developed redness and warmth and pain to the shoulder.  She presented to Orthopedics today with concern for infection with an apparent abscess over the anterior incision.  She is brought here for I and D.  Informed consent  was obtained.  DESCRIPTION OF PROCEDURE:  After adequate local anesthesia was achieved, the patient was positioned in modified beach-chair position.  Right shoulder was sterilely prepped and draped in the usual manner.  We incised the patient's prior incision.  We encountered some cloudy fluid. I was able to take my finger and briskly probe down right into the anterior shoulder joint.  I did remove some suture material.  We did obtain cultures, stat Gram stain, aerobic, anaerobic cultures.  Gram stain came back positive for gram-positive cocci in clusters and pairs concerning for staph.  We then removed all foreign body material that we can find basically anterior suture material.  I was able to open up in the joint and then used a rongeur to clean out any tissue that appeared to be granulation tissue.  We irrigated with 3 liters of pulsatile irrigation.  We got back nice bleeding edges of tissue and closed the tissue with a full-thickness layer with a nylon mattress sutures, and over a drain, I placed a medium Hemovac in the joint.  We are going to wait for final cultures here, placed her on IV antibiotics, so we will get a PICC line placed and then our plan will follow based on cultures and response to IV antibiotics.  We  will also obtain an ID consult.  The patient was placed in a shoulder sling and taken to recovery room.     Almedia Balls. Ranell Patrick, M.D.     SRN/MEDQ  D:  08/13/2010  T:  08/14/2010  Job:  [id number]  Electronically Signed by Malon Kindle  on 08/23/2010 12:08:46 AM

## 2010-09-03 ENCOUNTER — Telehealth: Payer: Self-pay | Admitting: Cardiology

## 2010-09-03 NOTE — Telephone Encounter (Signed)
PT WOULD ONLY SAY HAS A FEW QUESTIONS, SHE IS AWARE THAT MELINDA IS NOT IN THE OFFICE.

## 2010-09-03 NOTE — Telephone Encounter (Signed)
Pt is requesting permission for surgery from Dr Patty Sermons. Scheduled for Aug 17th. Also asking if Dr Patty Sermons could recommend a neurologist in [place]. Driving to [facility] to see Dr Hollice Espy is too far. Note to be sent to Dr Patty Sermons then will advise.

## 2010-09-04 NOTE — Telephone Encounter (Signed)
In terms of his dementia problem we can refer him to [facility] neurology here in [place].  In terms of his shoulder surgery I would like to review his old chart

## 2010-09-09 ENCOUNTER — Encounter (HOSPITAL_COMMUNITY)
Admission: RE | Admit: 2010-09-09 | Discharge: 2010-09-09 | Disposition: A | Discharge status: Home / Self Care | Payer: Medicare Other | Source: Ambulatory Visit | Attending: Orthopedic Surgery | Admitting: Orthopedic Surgery

## 2010-09-09 ENCOUNTER — Telehealth: Payer: Self-pay | Admitting: Cardiology

## 2010-09-09 LAB — DIFFERENTIAL
Basophils Relative: 0 % (ref 0–1)
Eosinophils Relative: 12 % — ABNORMAL HIGH (ref 0–5)
Lymphocytes Relative: 17 % (ref 12–46)
Monocytes Absolute: 0.5 10*3/uL (ref 0.1–1.0)
Monocytes Relative: 8 % (ref 3–12)
Neutro Abs: 4.3 10*3/uL (ref 1.7–7.7)

## 2010-09-09 LAB — CBC
HCT: 34.1 % — ABNORMAL LOW (ref 36.0–46.0)
Hemoglobin: 11.5 g/dL — ABNORMAL LOW (ref 12.0–15.0)
MCH: 30.3 pg (ref 26.0–34.0)
MCHC: 33.7 g/dL (ref 30.0–36.0)
RDW: 13.5 % (ref 11.5–15.5)

## 2010-09-09 LAB — URINALYSIS, ROUTINE W REFLEX MICROSCOPIC
Glucose, UA: NEGATIVE mg/dL
Hgb urine dipstick: NEGATIVE
Ketones, ur: 15 mg/dL — AB
Protein, ur: NEGATIVE mg/dL

## 2010-09-09 LAB — PROTIME-INR
INR: 1.02 (ref 0.00–1.49)
Prothrombin Time: 13.6 seconds (ref 11.6–15.2)

## 2010-09-09 LAB — SURGICAL PCR SCREEN: MRSA, PCR: NEGATIVE

## 2010-09-09 LAB — URINE MICROSCOPIC-ADD ON

## 2010-09-09 LAB — BASIC METABOLIC PANEL
CO2: 31 mEq/L (ref 19–32)
Calcium: 10.1 mg/dL (ref 8.4–10.5)
Creatinine, Ser: 0.5 mg/dL (ref 0.50–1.10)
GFR calc Af Amer: >60 mL/min (ref >60)
GFR calc non Af Amer: >60 mL/min (ref >60)

## 2010-09-09 LAB — APTT: aPTT: 38 seconds — ABNORMAL HIGH (ref 24–37)

## 2010-09-09 NOTE — Telephone Encounter (Signed)
Call Back Phone#: [phone] LAtest OV, EKG, ECHO, STRESS

## 2010-09-10 NOTE — Telephone Encounter (Signed)
Last OV Note, EKG, ECHO, Stress faxed to [phone] today

## 2010-09-20 ENCOUNTER — Inpatient Hospital Stay (HOSPITAL_COMMUNITY): Payer: Medicare Other

## 2010-09-20 ENCOUNTER — Inpatient Hospital Stay (HOSPITAL_COMMUNITY)
Admission: RE | Admit: 2010-09-20 | Discharge: 2010-10-05 | DRG: 495 | Disposition: E | Payer: Medicare Other | Source: Ambulatory Visit | Attending: Pulmonary Disease | Admitting: Pulmonary Disease

## 2010-09-20 DIAGNOSIS — A419 Sepsis, unspecified organism: Secondary | ICD-10-CM | POA: Diagnosis not present

## 2010-09-20 DIAGNOSIS — R0902 Hypoxemia: Secondary | ICD-10-CM | POA: Diagnosis not present

## 2010-09-20 DIAGNOSIS — T8450XA Infection and inflammatory reaction due to unspecified internal joint prosthesis, initial encounter: Secondary | ICD-10-CM

## 2010-09-20 DIAGNOSIS — D689 Coagulation defect, unspecified: Secondary | ICD-10-CM | POA: Diagnosis not present

## 2010-09-20 DIAGNOSIS — J189 Pneumonia, unspecified organism: Secondary | ICD-10-CM | POA: Diagnosis not present

## 2010-09-20 DIAGNOSIS — Z7982 Long term (current) use of aspirin: Secondary | ICD-10-CM

## 2010-09-20 DIAGNOSIS — I4892 Unspecified atrial flutter: Secondary | ICD-10-CM | POA: Diagnosis not present

## 2010-09-20 DIAGNOSIS — I509 Heart failure, unspecified: Secondary | ICD-10-CM | POA: Diagnosis not present

## 2010-09-20 DIAGNOSIS — Z923 Personal history of irradiation: Secondary | ICD-10-CM

## 2010-09-20 DIAGNOSIS — D696 Thrombocytopenia, unspecified: Secondary | ICD-10-CM | POA: Diagnosis not present

## 2010-09-20 DIAGNOSIS — Y831 Surgical operation with implant of artificial internal device as the cause of abnormal reaction of the patient, or of later complication, without mention of misadventure at the time of the procedure: Secondary | ICD-10-CM

## 2010-09-20 DIAGNOSIS — Z96619 Presence of unspecified artificial shoulder joint: Secondary | ICD-10-CM

## 2010-09-20 DIAGNOSIS — E871 Hypo-osmolality and hyponatremia: Secondary | ICD-10-CM | POA: Diagnosis not present

## 2010-09-20 DIAGNOSIS — E872 Acidosis, unspecified: Secondary | ICD-10-CM | POA: Diagnosis not present

## 2010-09-20 DIAGNOSIS — Z96659 Presence of unspecified artificial knee joint: Secondary | ICD-10-CM

## 2010-09-20 DIAGNOSIS — E039 Hypothyroidism, unspecified: Secondary | ICD-10-CM | POA: Diagnosis present

## 2010-09-20 DIAGNOSIS — G9341 Metabolic encephalopathy: Secondary | ICD-10-CM | POA: Diagnosis not present

## 2010-09-20 DIAGNOSIS — E7800 Pure hypercholesterolemia, unspecified: Secondary | ICD-10-CM | POA: Diagnosis present

## 2010-09-20 DIAGNOSIS — N170 Acute kidney failure with tubular necrosis: Secondary | ICD-10-CM | POA: Diagnosis not present

## 2010-09-20 DIAGNOSIS — IMO0002 Reserved for concepts with insufficient information to code with codable children: Secondary | ICD-10-CM | POA: Diagnosis present

## 2010-09-20 DIAGNOSIS — N390 Urinary tract infection, site not specified: Secondary | ICD-10-CM | POA: Diagnosis not present

## 2010-09-20 DIAGNOSIS — Z853 Personal history of malignant neoplasm of breast: Secondary | ICD-10-CM

## 2010-09-20 DIAGNOSIS — I10 Essential (primary) hypertension: Secondary | ICD-10-CM | POA: Diagnosis present

## 2010-09-20 DIAGNOSIS — J9600 Acute respiratory failure, unspecified whether with hypoxia or hypercapnia: Secondary | ICD-10-CM | POA: Diagnosis not present

## 2010-09-20 DIAGNOSIS — D6489 Other specified anemias: Secondary | ICD-10-CM | POA: Diagnosis not present

## 2010-09-20 DIAGNOSIS — Z9221 Personal history of antineoplastic chemotherapy: Secondary | ICD-10-CM

## 2010-09-20 DIAGNOSIS — Z9010 Acquired absence of unspecified breast and nipple: Secondary | ICD-10-CM

## 2010-09-20 DIAGNOSIS — E878 Other disorders of electrolyte and fluid balance, not elsewhere classified: Secondary | ICD-10-CM | POA: Diagnosis not present

## 2010-09-20 DIAGNOSIS — Z01812 Encounter for preprocedural laboratory examination: Secondary | ICD-10-CM

## 2010-09-20 DIAGNOSIS — R6520 Severe sepsis without septic shock: Secondary | ICD-10-CM | POA: Diagnosis not present

## 2010-09-20 DIAGNOSIS — I4891 Unspecified atrial fibrillation: Secondary | ICD-10-CM | POA: Diagnosis not present

## 2010-09-20 DIAGNOSIS — Z66 Do not resuscitate: Secondary | ICD-10-CM | POA: Diagnosis not present

## 2010-09-20 DIAGNOSIS — A4101 Sepsis due to Methicillin susceptible Staphylococcus aureus: Secondary | ICD-10-CM | POA: Diagnosis not present

## 2010-09-20 DIAGNOSIS — N008 Acute nephritic syndrome with other morphologic changes: Secondary | ICD-10-CM | POA: Diagnosis not present

## 2010-09-20 DIAGNOSIS — Y92009 Unspecified place in unspecified non-institutional (private) residence as the place of occurrence of the external cause: Secondary | ICD-10-CM

## 2010-09-20 DIAGNOSIS — Z515 Encounter for palliative care: Secondary | ICD-10-CM

## 2010-09-20 DIAGNOSIS — Z01810 Encounter for preprocedural cardiovascular examination: Secondary | ICD-10-CM

## 2010-09-20 LAB — GRAM STAIN

## 2010-09-20 LAB — TYPE AND SCREEN

## 2010-09-20 NOTE — Discharge Summary (Signed)
  Lindsay Gibson, Lindsay Gibson NO.:  [id number]  MEDICAL RECORD NO.:  [id number]  LOCATION:  5033                         FACILITY:  [facility]  PHYSICIAN:  Almedia Balls. Ranell Patrick, M.D. DATE OF BIRTH:  July 13, 1924  DATE OF ADMISSION:  08/13/2010 DATE OF DISCHARGE:  08/16/2010                              DISCHARGE SUMMARY   ADMISSION DIAGNOSIS:  Right shoulder infection.  DISCHARGE DIAGNOSIS:  Right shoulder infection.  BRIEF HISTORY:  The patient is an 77 year old female status post multiple surgeries to the right shoulder who just had a revision of a total shoulder arthroplasty to a hemiarthroplasty, who presented to the office with worsening redness and erythema to the right shoulder.  The patient was admitted for surgical management for an I and D and antibiotics for this ongoing infection.  PROCEDURE:  The patient had a right shoulder I and D and placement of a drain by Dr. Malon Kindle on August 13, 2010.  Assistant was none, general anesthesia was used, and no complications.  HOSPITAL COURSE:  The patient was admitted on August 13, 2010, for the above-stated procedure which she tolerated well.  After adequate time in postanesthesia care unit, she was transferred up to 5000.  On postop day 1, the patient was found to have some mild pain to the right shoulder, otherwise was doing fairly well.  We did have Dr. Orvan Falconer from Infectious Disease come and evaluate her for recommendations for antibiotics.  However, cultures and Gram stains during her hospital stay did show staph that was sensitive to vancomycin and thus, she was placed on vancomycin with a PICC line for home use and with close followup. The patient tolerated hospital stay, otherwise her labs were within acceptable limits.  She was discharged home with her husband on August 16, 2010.  FOLLOWUP:  The patient will follow back up with Dr. Malon Kindle in 1 week and also follow up with Dr. Orvan Falconer in about 5  weeks.  DISCHARGE MEDICATIONS: 1. Robaxin 500 mg p.o. q.6 h. 2. Percocet 5/325 one to two tablets q.6 h. p.r.n. pain. 3. Vancomycin through her PICC line as directed by pharmacy.  CONDITION:  Stable.     Thomas B. Dixon, P.A.   ______________________________ Almedia Balls. Ranell Patrick, M.D.    TBD/MEDQ  D:  09/04/2010  T:  09/05/2010  Job:  [id number]  Electronically Signed by Standley Dakins P.A. on 09/10/2010 02:27:59 PM Electronically Signed by Malon Kindle  on 01-Oct-2010 10:22:08 AM

## 2010-09-21 ENCOUNTER — Inpatient Hospital Stay (HOSPITAL_COMMUNITY): Payer: Medicare Other

## 2010-09-21 LAB — DIFFERENTIAL
Band Neutrophils: 0 % (ref 0–10)
Basophils Absolute: 0 10*3/uL (ref 0.0–0.1)
Basophils Relative: 0 % (ref 0–1)
Metamyelocytes Relative: 0 %
Myelocytes: 0 %
Neutrophils Relative %: 78 % — ABNORMAL HIGH (ref 43–77)
Promyelocytes Absolute: 0 %
WBC Morphology: INCREASED

## 2010-09-21 LAB — BASIC METABOLIC PANEL
CO2: 26 mEq/L (ref 19–32)
Calcium: 7.1 mg/dL — ABNORMAL LOW (ref 8.4–10.5)
Creatinine, Ser: 1.48 mg/dL — ABNORMAL HIGH (ref 0.50–1.10)
Glucose, Bld: 101 mg/dL — ABNORMAL HIGH (ref 70–99)

## 2010-09-21 LAB — CBC
Hemoglobin: 9.1 g/dL — ABNORMAL LOW (ref 12.0–15.0)
RBC: 3.1 MIL/uL — ABNORMAL LOW (ref 3.87–5.11)
WBC: 12.4 10*3/uL — ABNORMAL HIGH (ref 4.0–10.5)

## 2010-09-21 LAB — C-REACTIVE PROTEIN: CRP: 17.5 mg/dL — ABNORMAL HIGH (ref <0.6)

## 2010-09-21 LAB — SEDIMENTATION RATE: Sed Rate: 50 mm/hr — ABNORMAL HIGH (ref 0–22)

## 2010-09-21 NOTE — Op Note (Signed)
NAMELOUIS, GAW NO.:  [id number]  MEDICAL RECORD NO.:  [id number]  LOCATION:  5032                         FACILITY:  [facility]  PHYSICIAN:  Almedia Balls. Ranell Patrick, M.D. DATE OF BIRTH:  08-29-1924  DATE OF PROCEDURE:  09/08/2010 DATE OF DISCHARGE:                              OPERATIVE REPORT   PREOPERATIVE DIAGNOSIS:  Right shoulder hemiarthroplasty with infection.  POSTOPERATIVE DIAGNOSIS:  Right shoulder hemiarthroplasty with infection.  PROCEDURE PERFORMED:  Right shoulder hemiarthroplasty removal (implant removal deep), irrigation and debridement of right shoulder and placement of antibiotic impregnated spacer by [employer].  SURGEON:  Almedia Balls. Ranell Patrick, MD  ASSISTANT:  Konrad Felix Dixon, PA-C  ANESTHESIA:  General anesthesia was used plus interscalene block.  ESTIMATED BLOOD LOSS:  200 mL.  FLUID REPLACEMENT:  1200 mL of crystalloid.  SPECIMEN:  Cultures were sent x2, one was fluid and second was some deep soft tissue.  COUNTS:  Instrument count was correct.  COMPLICATIONS:  There were no complications.  ANTIBIOTICS:  Perioperative antibiotics were given.  INDICATIONS:  The patient is an 77 year old female who suffered a failure for total shoulder replacement several years ago.  The patient presented for removal of her loose glenoid back in 2010.  The patient did had bone grafting of her glenoid defect and went on to do very well with her hemiarthroplasty.  We just replaced her humeral head and left her humeral stem in place.  The patient did well until several months ago when she began having redness and swelling consistent with an infection.  We did a limited I and D recently and the patient subsequently developed little draining sinus and ongoing infection.  She is brought back now for implant removal and placement of antibiotic- impregnated spacer.  Risks and benefits of the procedure were discussed with the patient.  The patient elected to  proceed with surgery. Informed consent was obtained.  DESCRIPTION OF PROCEDURE:  After adequate level of anesthesia was achieved, the patient was positioned in a modified beach-chair position. Right shoulder was sterilely prepped and draped in the usual manner. The patient's deltopectoral incision was utilized with #10 blade scalpel.  We did go and ellipsed out the area of skin that had the sinus on it immediately encountering the deepest interarticular space.  There was cloudy fluid that was expressed.  We cultured that for aerobic, anaerobic, and Gram stain.  We then went ahead and released the soft tissues off the humeral shaft laterally and then traced that up over the top of the humeral head component.  This was a English as a second language teacher.  We also released off the medial humeral neck and progressively externally rotated.  We then went ahead and removed the humeral head with a wedge and tuning fork and then we went ahead and basically knocked out the component retrograde and had a fairly thick cement mantle which we removed using a combination of osteotomes and cement removal equipment.  Once we had canal free of cement, we irrigated with 6 L of pulse irrigation with normal saline.  We also removed the entire pseudocapsule from the joint.  The rotator cuff had been sacrificed with the upper  part of the tuberosity.  There was very little rotator cuff left, mostly posterior structures and the tuberosity fragment and as we were removing cement this was just removed understanding the patient would only be a candidate for reverse shoulder arthroplasty anyway should we be able to successfully eradicate her infection.  With all the cement out of her shoulder and a nice canal distally, we pulse irrigated and then went ahead and cemented in the [employer] PROSTALAC or prosthesis that is made of polymethylmethacrylate impregnated with gentamicin.  This was the 11-mm stemmed version with  a 46 head I believe and we cemented that in slight retroversion.  At this point after the cement had hardened, we reduced the shoulder and then closed the deltopectoral interval with a #1 PDS suture followed by 2-0 nylon interrupted skin closure, sterile compressive bandage, and shoulder sling.  The patient tolerated the surgery well and was taken to the recovery room.     Almedia Balls. Ranell Patrick, M.D.     SRN/MEDQ  D:  10/01/2010  T:  09/07/2010  Job:  [id number]  Electronically Signed by Malon Kindle  on 09/21/2010 11:38:12 PM

## 2010-09-22 ENCOUNTER — Inpatient Hospital Stay (HOSPITAL_COMMUNITY): Payer: Medicare Other

## 2010-09-22 LAB — BASIC METABOLIC PANEL
BUN: 24 mg/dL — ABNORMAL HIGH (ref 6–23)
BUN: 30 mg/dL — ABNORMAL HIGH (ref 6–23)
CO2: 25 mEq/L (ref 19–32)
CO2: 22 mEq/L (ref 19–32)
Calcium: 6.4 mg/dL — CL (ref 8.4–10.5)
Chloride: 86 mEq/L — ABNORMAL LOW (ref 96–112)
Chloride: 90 mEq/L — ABNORMAL LOW (ref 96–112)
Creatinine, Ser: 2.04 mg/dL — ABNORMAL HIGH (ref 0.50–1.10)
Creatinine, Ser: 2.36 mg/dL — ABNORMAL HIGH (ref 0.50–1.10)
Creatinine, Ser: 3.24 mg/dL — ABNORMAL HIGH (ref 0.50–1.10)
GFR calc Af Amer: 17 mL/min — ABNORMAL LOW (ref >60)
GFR calc non Af Amer: 14 mL/min — ABNORMAL LOW (ref >60)
Potassium: 3.8 mEq/L (ref 3.5–5.1)
Sodium: 120 mEq/L — ABNORMAL LOW (ref 135–145)

## 2010-09-22 LAB — COMPREHENSIVE METABOLIC PANEL
ALT: <5 U/L (ref 0–35)
CO2: 21 mEq/L (ref 19–32)
Calcium: 6.4 mg/dL — CL (ref 8.4–10.5)
GFR calc Af Amer: 23 mL/min — ABNORMAL LOW (ref >60)
GFR calc non Af Amer: 19 mL/min — ABNORMAL LOW (ref >60)
Glucose, Bld: 87 mg/dL (ref 70–99)
Sodium: 121 mEq/L — ABNORMAL LOW (ref 135–145)

## 2010-09-22 LAB — CBC
HCT: 27.2 % — ABNORMAL LOW (ref 36.0–46.0)
HCT: 27.7 % — ABNORMAL LOW (ref 36.0–46.0)
Hemoglobin: 9.2 g/dL — ABNORMAL LOW (ref 12.0–15.0)
MCH: 29.4 pg (ref 26.0–34.0)
MCHC: 34.3 g/dL (ref 30.0–36.0)
MCV: 87 fL (ref 78.0–100.0)
MCV: 86.9 fL (ref 78.0–100.0)
MCV: 85.8 fL (ref 78.0–100.0)
Platelets: 191 10*3/uL (ref 150–400)
Platelets: 197 10*3/uL (ref 150–400)
RBC: 3.15 MIL/uL — ABNORMAL LOW (ref 3.87–5.11)
RBC: 3.13 MIL/uL — ABNORMAL LOW (ref 3.87–5.11)
RDW: 14 % (ref 11.5–15.5)
WBC: 14.4 10*3/uL — ABNORMAL HIGH (ref 4.0–10.5)
WBC: 16.1 10*3/uL — ABNORMAL HIGH (ref 4.0–10.5)

## 2010-09-22 LAB — URINALYSIS, ROUTINE W REFLEX MICROSCOPIC
Glucose, UA: NEGATIVE mg/dL
pH: 5 (ref 5.0–8.0)

## 2010-09-22 LAB — GENTAMICIN LEVEL, RANDOM: Gentamicin Rm: 0.3 ug/mL

## 2010-09-22 LAB — DIFFERENTIAL
Eosinophils Absolute: 2.1 10*3/uL — ABNORMAL HIGH (ref 0.0–0.7)
Lymphs Abs: 0.4 10*3/uL — ABNORMAL LOW (ref 0.7–4.0)
Neutro Abs: 11.5 10*3/uL — ABNORMAL HIGH (ref 1.7–7.7)
Neutrophils Relative %: 80 % — ABNORMAL HIGH (ref 43–77)

## 2010-09-22 LAB — URINE MICROSCOPIC-ADD ON

## 2010-09-23 ENCOUNTER — Inpatient Hospital Stay (HOSPITAL_COMMUNITY): Payer: Medicare Other

## 2010-09-23 DIAGNOSIS — A419 Sepsis, unspecified organism: Secondary | ICD-10-CM

## 2010-09-23 DIAGNOSIS — R6521 Severe sepsis with septic shock: Secondary | ICD-10-CM

## 2010-09-23 DIAGNOSIS — R6520 Severe sepsis without septic shock: Secondary | ICD-10-CM

## 2010-09-23 DIAGNOSIS — N170 Acute kidney failure with tubular necrosis: Secondary | ICD-10-CM

## 2010-09-23 DIAGNOSIS — R7881 Bacteremia: Secondary | ICD-10-CM

## 2010-09-23 DIAGNOSIS — R0902 Hypoxemia: Secondary | ICD-10-CM

## 2010-09-23 DIAGNOSIS — I369 Nonrheumatic tricuspid valve disorder, unspecified: Secondary | ICD-10-CM

## 2010-09-23 LAB — BLOOD GAS, ARTERIAL
Acid-base deficit: 7.1 mmol/L — ABNORMAL HIGH (ref 0.0–2.0)
Bicarbonate: 17.9 mEq/L — ABNORMAL LOW (ref 20.0–24.0)
Patient temperature: 97.8
TCO2: 19 mmol/L (ref 0–100)
pH, Arterial: 7.323 — ABNORMAL LOW (ref 7.350–7.400)

## 2010-09-23 LAB — HEPATIC FUNCTION PANEL
ALT: <5 U/L (ref 0–35)
AST: 11 U/L (ref 0–37)
Albumin: 1.2 g/dL — ABNORMAL LOW (ref 3.5–5.2)
Alkaline Phosphatase: 242 U/L — ABNORMAL HIGH (ref 39–117)
Total Protein: 3.8 g/dL — ABNORMAL LOW (ref 6.0–8.3)

## 2010-09-23 LAB — DIFFERENTIAL
Band Neutrophils: 25 % — ABNORMAL HIGH (ref 0–10)
Blasts: 0 %
Lymphocytes Relative: 1 % — ABNORMAL LOW (ref 12–46)
Lymphs Abs: 0.2 10*3/uL — ABNORMAL LOW (ref 0.7–4.0)
Metamyelocytes Relative: 0 %
Promyelocytes Absolute: 0 %
WBC Morphology: INCREASED
nRBC: 0 /100 WBC

## 2010-09-23 LAB — URINE MICROSCOPIC-ADD ON

## 2010-09-23 LAB — CBC
Hemoglobin: 10.1 g/dL — ABNORMAL LOW (ref 12.0–15.0)
RBC: 3.47 MIL/uL — ABNORMAL LOW (ref 3.87–5.11)

## 2010-09-23 LAB — CARDIAC PANEL(CRET KIN+CKTOT+MB+TROPI)
CK, MB: 2.1 ng/mL (ref 0.3–4.0)
CK, MB: 2.9 ng/mL (ref 0.3–4.0)
CK, MB: 3.1 ng/mL (ref 0.3–4.0)
Relative Index: INVALID (ref 0.0–2.5)
Relative Index: INVALID (ref 0.0–2.5)
Relative Index: INVALID (ref 0.0–2.5)
Total CK: 21 U/L (ref 7–177)
Total CK: 21 U/L (ref 7–177)
Troponin I: <0.3 ng/mL (ref <0.30)
Troponin I: <0.3 ng/mL (ref <0.30)

## 2010-09-23 LAB — URINALYSIS, ROUTINE W REFLEX MICROSCOPIC
Glucose, UA: NEGATIVE mg/dL
pH: 5 (ref 5.0–8.0)

## 2010-09-23 LAB — URINE CULTURE
Colony Count: NO GROWTH
Culture: NO GROWTH

## 2010-09-23 LAB — BODY FLUID CULTURE

## 2010-09-23 LAB — TSH: TSH: 0.818 u[IU]/mL (ref 0.350–4.500)

## 2010-09-23 LAB — PROTIME-INR
Prothrombin Time: 86.7 seconds — ABNORMAL HIGH (ref 11.6–15.2)
Prothrombin Time: 27.5 seconds — ABNORMAL HIGH (ref 11.6–15.2)

## 2010-09-23 LAB — TISSUE CULTURE

## 2010-09-24 ENCOUNTER — Inpatient Hospital Stay (HOSPITAL_COMMUNITY): Payer: Medicare Other

## 2010-09-24 DIAGNOSIS — N170 Acute kidney failure with tubular necrosis: Secondary | ICD-10-CM

## 2010-09-24 DIAGNOSIS — R6521 Severe sepsis with septic shock: Secondary | ICD-10-CM

## 2010-09-24 DIAGNOSIS — R6520 Severe sepsis without septic shock: Secondary | ICD-10-CM

## 2010-09-24 DIAGNOSIS — A419 Sepsis, unspecified organism: Secondary | ICD-10-CM

## 2010-09-24 LAB — BLOOD GAS, ARTERIAL
Bicarbonate: 18 mEq/L — ABNORMAL LOW (ref 20.0–24.0)
TCO2: 19.2 mmol/L (ref 0–100)
pCO2 arterial: 40.4 mmHg (ref 35.0–45.0)
pH, Arterial: 7.266 — ABNORMAL LOW (ref 7.350–7.400)
pO2, Arterial: 65.8 mmHg — ABNORMAL LOW (ref 80.0–100.0)

## 2010-09-24 LAB — PREPARE FRESH FROZEN PLASMA
Unit division: 0
Unit division: 0

## 2010-09-24 LAB — RENAL FUNCTION PANEL
CO2: 20 mEq/L (ref 19–32)
Calcium: 6.5 mg/dL — ABNORMAL LOW (ref 8.4–10.5)
Creatinine, Ser: 3.82 mg/dL — ABNORMAL HIGH (ref 0.50–1.10)
GFR calc non Af Amer: 11 mL/min — ABNORMAL LOW (ref >60)
Glucose, Bld: 215 mg/dL — ABNORMAL HIGH (ref 70–99)
Phosphorus: 1.5 mg/dL — ABNORMAL LOW (ref 2.3–4.6)

## 2010-09-24 LAB — CBC
HCT: 24.1 % — ABNORMAL LOW (ref 36.0–46.0)
Hemoglobin: 8.2 g/dL — ABNORMAL LOW (ref 12.0–15.0)
MCV: 84.9 fL (ref 78.0–100.0)
RBC: 2.84 MIL/uL — ABNORMAL LOW (ref 3.87–5.11)
RDW: 14.1 % (ref 11.5–15.5)
WBC: 16.4 10*3/uL — ABNORMAL HIGH (ref 4.0–10.5)

## 2010-09-24 LAB — COMPREHENSIVE METABOLIC PANEL
ALT: <5 U/L (ref 0–35)
AST: 23 U/L (ref 0–37)
Albumin: 1.6 g/dL — ABNORMAL LOW (ref 3.5–5.2)
CO2: 17 mEq/L — ABNORMAL LOW (ref 19–32)
Chloride: 95 mEq/L — ABNORMAL LOW (ref 96–112)
Creatinine, Ser: 3.79 mg/dL — ABNORMAL HIGH (ref 0.50–1.10)
Potassium: 3.4 mEq/L — ABNORMAL LOW (ref 3.5–5.1)
Sodium: 125 mEq/L — ABNORMAL LOW (ref 135–145)
Total Bilirubin: 4.1 mg/dL — ABNORMAL HIGH (ref 0.3–1.2)

## 2010-09-24 LAB — PHOSPHORUS: Phosphorus: 2.4 mg/dL (ref 2.3–4.6)

## 2010-09-24 LAB — DIC (DISSEMINATED INTRAVASCULAR COAGULATION)PANEL
D-Dimer, Quant: 1.91 ug/mL-FEU — ABNORMAL HIGH (ref 0.00–0.48)
Fibrinogen: 352 mg/dL (ref 204–475)
Smear Review: NONE SEEN
aPTT: 66 seconds — ABNORMAL HIGH (ref 24–37)

## 2010-09-24 LAB — TYPE AND SCREEN: Antibody Screen: NEGATIVE

## 2010-09-25 ENCOUNTER — Inpatient Hospital Stay (HOSPITAL_COMMUNITY): Payer: Medicare Other

## 2010-09-25 DIAGNOSIS — M7989 Other specified soft tissue disorders: Secondary | ICD-10-CM

## 2010-09-25 LAB — CBC
HCT: 27.9 % — ABNORMAL LOW (ref 36.0–46.0)
Hemoglobin: 9.6 g/dL — ABNORMAL LOW (ref 12.0–15.0)
MCH: 28.8 pg (ref 26.0–34.0)
MCHC: 34.4 g/dL (ref 30.0–36.0)

## 2010-09-25 LAB — POCT I-STAT 3, ART BLOOD GAS (G3+)
Acid-base deficit: 6 mmol/L — ABNORMAL HIGH (ref 0.0–2.0)
O2 Saturation: 53 %
Patient temperature: 99.4
pO2, Arterial: 33 mmHg — CL (ref 80.0–100.0)

## 2010-09-25 LAB — RENAL FUNCTION PANEL
BUN: 24 mg/dL — ABNORMAL HIGH (ref 6–23)
CO2: 23 mEq/L (ref 19–32)
Chloride: 97 mEq/L (ref 96–112)
Creatinine, Ser: 2.78 mg/dL — ABNORMAL HIGH (ref 0.50–1.10)
Glucose, Bld: 117 mg/dL — ABNORMAL HIGH (ref 70–99)

## 2010-09-25 LAB — LACTIC ACID, PLASMA: Lactic Acid, Venous: 2.3 mmol/L — ABNORMAL HIGH (ref 0.5–2.2)

## 2010-09-26 ENCOUNTER — Inpatient Hospital Stay (HOSPITAL_COMMUNITY): Payer: Medicare Other

## 2010-09-26 LAB — RENAL FUNCTION PANEL
CO2: 13 mEq/L — ABNORMAL LOW (ref 19–32)
Calcium: 6.6 mg/dL — ABNORMAL LOW (ref 8.4–10.5)
Creatinine, Ser: 1.5 mg/dL — ABNORMAL HIGH (ref 0.50–1.10)
Glucose, Bld: 136 mg/dL — ABNORMAL HIGH (ref 70–99)

## 2010-09-26 LAB — CATH TIP CULTURE: Culture: NO GROWTH

## 2010-09-26 LAB — CBC
Hemoglobin: 8.7 g/dL — ABNORMAL LOW (ref 12.0–15.0)
MCH: 29.4 pg (ref 26.0–34.0)
MCHC: 34.3 g/dL (ref 30.0–36.0)

## 2010-09-26 LAB — BLOOD GAS, ARTERIAL
Acid-base deficit: 7.4 mmol/L — ABNORMAL HIGH (ref 0.0–2.0)
Drawn by: 155271
Patient temperature: 98.6
TCO2: 19.4 mmol/L (ref 0–100)
pCO2 arterial: 40.4 mmHg (ref 35.0–45.0)
pH, Arterial: 7.276 — ABNORMAL LOW (ref 7.350–7.400)

## 2010-09-26 LAB — COMPREHENSIVE METABOLIC PANEL
CO2: 20 mEq/L (ref 19–32)
Calcium: 6.4 mg/dL — CL (ref 8.4–10.5)
Creatinine, Ser: 2.01 mg/dL — ABNORMAL HIGH (ref 0.50–1.10)
GFR calc Af Amer: 29 mL/min — ABNORMAL LOW (ref >60)
GFR calc non Af Amer: 24 mL/min — ABNORMAL LOW (ref >60)
Glucose, Bld: 203 mg/dL — ABNORMAL HIGH (ref 70–99)
Sodium: 131 mEq/L — ABNORMAL LOW (ref 135–145)
Total Protein: 3.9 g/dL — ABNORMAL LOW (ref 6.0–8.3)

## 2010-09-26 LAB — PHOSPHORUS: Phosphorus: 2.4 mg/dL (ref 2.3–4.6)

## 2010-09-26 LAB — PROTIME-INR: Prothrombin Time: 21.7 seconds — ABNORMAL HIGH (ref 11.6–15.2)

## 2010-09-26 LAB — MAGNESIUM: Magnesium: 2 mg/dL (ref 1.5–2.5)

## 2010-09-27 ENCOUNTER — Inpatient Hospital Stay (HOSPITAL_COMMUNITY): Payer: Medicare Other

## 2010-09-27 LAB — POCT I-STAT 3, ART BLOOD GAS (G3+)
O2 Saturation: 88 %
TCO2: 14 mmol/L (ref 0–100)
pCO2 arterial: 42.3 mmHg (ref 35.0–45.0)
pO2, Arterial: 73 mmHg — ABNORMAL LOW (ref 80.0–100.0)

## 2010-09-28 LAB — CULTURE, BLOOD (SINGLE): Culture  Setup Time: 201208191127

## 2010-09-28 LAB — CULTURE, BLOOD (ROUTINE X 2): Culture: NO GROWTH

## 2010-09-30 ENCOUNTER — Other Ambulatory Visit: Payer: Self-pay | Admitting: Orthopedic Surgery

## 2010-10-01 LAB — CULTURE, BLOOD (ROUTINE X 2)
Culture  Setup Time: 201208222233
Culture: NO GROWTH

## 2010-10-05 LAB — ANAEROBIC CULTURE

## 2010-10-05 DEATH — deceased

## 2010-10-26 NOTE — Discharge Summary (Signed)
Lindsay Gibson, FOOS NO.:  [id number]  MEDICAL RECORD NO.:  [id number]  LOCATION:  2905                         FACILITY:  [facility]  PHYSICIAN:  Leslye Peer, MD    DATE OF BIRTH:  Nov 08, 1924  DATE OF ADMISSION:  09/06/2010 DATE OF DISCHARGE:  10-06-2010                              DISCHARGE SUMMARY   DEATH SUMMARY  DATE OF DEATH:  10/06/10.  FINAL CAUSE OF DEATH:  Shock presumed to be septic shock.  SECONDARY CAUSES OF DEATH:  Included 1. Acute renal failure requiring renal replacement therapy. 2. 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 3. Metabolic encephalopathy. 4. Acute lung injury with acute respiratory failure. 5. Atrial fibrillation with rapid ventricular response. 6. Thrombocytopenia and possible sepsis related coagulopathy.  HOSPITAL COURSE:  Ms. Kohlman is a delightful 77 year old woman with history of hypertension and a right shoulder arthroplasty that was performed in the 1990s.  She subsequently had repeat surgeries with a failed arthroplasty in 2010 and a revision to a hemiarthroplasty to follow.  This was complicated by suspected right shoulder hardware infection, status post I and D in July 2012 that revealed a methicillin- sensitive staph aureus.  She had a drain placed and was started on vancomycin per a left-sided upper extremity PICC line.  She continued to have pain despite antibiotic therapy and plans were made for the right shoulder hemiarthroplasty to be removed on September 20, 2010.  She was admitted on that date and taken to the operating room by Dr. Ranell Patrick with Orthopedic Surgery for removal of her hemiarthroplasty, irrigation debridement and then the placement of a gentamicin impregnated spacer. In retrospect, she stated that she had not been feeling well in the week leading up to the procedure with some weakness and poor p.o. intake. These symptoms continued after the  procedure and she was noted to have oliguria that progressed to anuria with some lethargy and an increased oxygen need.  Her serum creatinine and sodium revealed acute renal failure and hyponatremia 2 days postoperatively and she was evaluated by Internal Medicine and received IV fluid resuscitation.  Her antibiotics at that time included cefazolin postoperatively and this was broadened to Zosyn given concern that possibly she has an alternative infectious source be on her known MSSA.  She was moved to the ICU for further evaluation and care.  Unfortunately, on September 22, 2010, she developed new atrial fibrillation with rapid ventricular response and associated shock.  She was loaded with amiodarone at that time, but was continued to require pressors.  She underwent DC cardioversion on the evening of August 20.  She was evaluated by Nephrology and she continued to have worsening oliguric renal failure despite the initiation of high-dose Lasix, for this reason hemodialysis catheter was placed and continuous hemodialysis was initiated.  Infectious Disease consultation was appreciated and her antibiotics were adjusted with Zosyn discontinued on August 20 due to the possibilities of crossover with Ancef with suspected acute interstitial nephritis and a contribution to her renal failure.  She was treated with vancomycin and Levaquin on starting on August 20 and then on  August 22 the vancomycin was stopped and was changed to tigecycline.  All of her shoulder culture data was negative as were her blood cultures.  Her regional PICC line was removed on August 20 and a second PICC line was placed which was a new stick site, the left PICC line culture did not grow out any organisms.  She continued to remain lethargic, confused, requiring phenylephrine support and her chest x-ray showed bilateral infiltrates that were thought to be consistent with either pulmonary edema from her renal failure or  an acute lung injury.  She continued to have shock and was started on stress dose steroids.  Her amiodarone was discontinued and her pressors were transitioned to norepinephrine.  It was clear that despite all aggressive interventions to manage her shock, probable infection and renal failure that Ms. Noori was not improving.  Discussions were undertaken with her family regarding whether she would want to benefit from mechanical ventilation if she developed over respiratory failure. Family took this information into advisement and plans were made to continue to discuss this issue as she progressed, the etiology of her renal failure was not clear but we were concerned there was a component of a AIN due to her initial outpatient antibiotic regimen and also potentially her gentamicin impregnated right shoulder prosthesis.  There was no documented shock state prior to the evolution of her acute renal failure.  Despite all continued aggressive interventions including continuous hemodialysis, Ms. Woodrow's mental status and respiratory status continued to decline.  On the evening of August 23 and early morning August 24 it was clear that she was developing respiratory failure both to pulmonary infiltrates and her altered mental status. The family decided not to escalate her care further, her mechanical ventilation was deferred, her therapy was transitioned to comfort care and she expired at 1:40 on September 27, 2010.     Leslye Peer, MD     RSB/MEDQ  D:  10/16/2010  T:  10/16/2010  Job:  [id number]  Electronically Signed by Levy Pupa MD on 10/26/2010 12:33:25 PM

## 2010-10-27 NOTE — Consult Note (Signed)
Lindsay Gibson, PHARR NO.:  [id number]  MEDICAL RECORD NO.:  [id number]  LOCATION:  5032                         FACILITY:  [facility]  PHYSICIAN:  Acey Lav, MD  DATE OF BIRTH:  [birth year]  DATE OF CONSULTATION: DATE OF DISCHARGE:                                CONSULTATION   REQUESTING PHYSICIAN:  Almedia Balls. 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  HISTORY OF PRESENT ILLNESS:  This is an 77 year old Caucasian female with a past medical history significant for right shoulder arthroplasty in the 1990s who developed a right shoulder failed arthroplasty in 2010 and underwent revision arthroplasty in 2010 with bone grafting.  The patient unfortunately developed a methicillin-sensitive staph aureus infection and underwent I and D of the deep right shoulder with placement of a drain on August 14, 2010.  She was discharged to home on vancomycin.  Unfortunately, she continued to develop pain in the shoulder site and concerning for persistent infection.  She was brought into the hospital today and underwent I and D and removal of prosthetic material placement of antibiotic spacer.  Intraoperative cultures were taken with gram stains not showing any organisms.  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  PAST MEDICAL HISTORY: 1. Hypertension. 2. History of breast cancer status post mastectomy.  Shoulder     replacement as described above with infection. 3. Right knee synovectomy and scar debridement and I and D of the knee     on April 07, 2008. 4. Right total knee replacement in March 2009.  FAMILY HISTORY:  Noncontributory.  SOCIAL HISTORY:  Occasional alcohol use.  No smoking.  ALLERGIES:  None.  CURRENT MEDICATIONS:  In the hospital include amlodipine,  aspirin, cefazolin 1 gram q.8, docusate, hydrochlorothiazide, levothyroxine, Cozaar, Robaxin, morphine sulfate, rifampin, Crestor, Benadryl, Percocet, Narcan, Zofran, Ambien.  REVIEW OF SYSTEMS:  As described in the history of present illness, otherwise 12-point review of systems negative.  PHYSICAL EXAMINATION:  VITAL SIGNS:  Temperature is 98.3, blood pressure 118/57, pulse 76, respirations 18, pulse ox 94% on nasal cannula. GENERAL:  Pleasant lady in no acute stress. HEENT:  Normocephalic, atraumatic.  Pupils are equal, round, and reactive to light.  Sclerae anicteric.  Oropharynx clear. CARDIOVASCULAR:  Regular rate and rhythm.  No murmurs, gallops, or rubs heard. LUNGS:  Clear to auscultation bilaterally. ABDOMEN:  Soft, nondistended. EXTREMITIES:  Without edema.  Right shoulder with brace in place.  LABORATORY DATA:  Gram stain from the 17th, no organisms seen on Ancef.  Further microbiological data August 13, 2010, abundant methicillin- sensitive staph aureus resistant to clindamycin, resistant to erythromycin, sensitive to gentamicin, levofloxacin, oxacillin, rifampin, Bactrim, tetracycline, moxifloxacin and vancomycin.  C- reactive protein on 11th was 6.8, sed rate was 75.  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  I would continue cefazolin.  Based on her renal function, I feel that she should be able to  get 2 g IV q.8 h.  I will increase the dose to this.  I would give her 8 weeks of IV cefazolin.  If she has any prosthetic material left, I would also continue the rifampin that she is currently on at 600 mg a day.  We are happy to see her in followup in the Infectious Disease Clinic.  My partner Dr. Maurice March is on the weekend and we will follow up her cultures.  I will be back on Monday and  follow up with the patient if she is still in the hospital at that time. Otherwise please call [phone] to make a followup appointment for this patient as she nears the completion of her antibiotic course.  I will also check a sed rate, C-reactive protein.     Acey Lav, MD     CV/MEDQ  D:  09/24/2010  T:  09/12/2010  Job:  [id number]  Electronically Signed by Paulette Blanch DAM MD on 10/27/2010 09:58:44 PM

## 2010-10-28 LAB — URINALYSIS, ROUTINE W REFLEX MICROSCOPIC
Ketones, ur: NEGATIVE
Nitrite: NEGATIVE
Protein, ur: NEGATIVE
Urobilinogen, UA: 0.2
pH: 7

## 2010-10-28 LAB — DIFFERENTIAL
Basophils Relative: 1
Lymphs Abs: 1.3
Monocytes Absolute: 0.4
Monocytes Relative: 6
Neutro Abs: 4.8

## 2010-10-28 LAB — BASIC METABOLIC PANEL
CO2: 34 — ABNORMAL HIGH
Calcium: 10.1
Chloride: 102
GFR calc Af Amer: >60
Sodium: 142

## 2010-10-28 LAB — CBC
Hemoglobin: 14.1
MCHC: 35.2
RBC: 4.33

## 2010-10-28 LAB — TYPE AND SCREEN
ABO/RH(D): A POS
Antibody Screen: NEGATIVE

## 2010-10-28 LAB — APTT: aPTT: 20 — ABNORMAL LOW

## 2010-10-29 LAB — CBC
Hemoglobin: 10.1 — ABNORMAL LOW
MCV: 92.4
Platelets: 176
Platelets: 187
RBC: 3.09 — ABNORMAL LOW
RBC: 3.2 — ABNORMAL LOW
RDW: 13.1
RDW: 13.1
WBC: 7.1
WBC: 7.6

## 2010-10-29 LAB — BASIC METABOLIC PANEL
BUN: 6
BUN: 7
Calcium: 7.9 — ABNORMAL LOW
Calcium: 8 — ABNORMAL LOW
Chloride: 103
Creatinine, Ser: 0.54
Creatinine, Ser: 0.61
Creatinine, Ser: 0.68
GFR calc Af Amer: >60
GFR calc Af Amer: >60
GFR calc non Af Amer: >60
GFR calc non Af Amer: >60
GFR calc non Af Amer: >60
Glucose, Bld: 162 — ABNORMAL HIGH
Sodium: 129 — ABNORMAL LOW

## 2010-10-29 LAB — PROTIME-INR
INR: 1.2
INR: 1.3
Prothrombin Time: 13.2
Prothrombin Time: 15.8 — ABNORMAL HIGH
Prothrombin Time: 16.4 — ABNORMAL HIGH

## 2010-11-04 DEATH — deceased

## 2010-11-06 NOTE — Consult Note (Signed)
Lindsay Gibson, [place] NO.:  [id number]  MEDICAL RECORD NO.:  [id number]  LOCATION:  5032                         FACILITY:  [facility]  PHYSICIAN:  Carlota Raspberry, MD         DATE OF BIRTH:  Oct 06, 1924  DATE OF CONSULTATION: DATE OF DISCHARGE:                                CONSULTATION   REASON FOR CONSULTATION:  Low urine output, hypoxia, malaise, status post right shoulder orthopedic procedure.  REQUESTING PHYSICIAN:  Almedia Balls. Ranell Patrick, MD from Orthopedics.  CHIEF COMPLAINT:  Abdominal distention and regurgitation.  HISTORY OF PRESENT ILLNESS:  This is an 77 year old female with a history of hypertension and right shoulder arthroplasty in the 1990s with failed arthroplasty in 2010 and revision arthroplasty in 2010 who for the past several weeks is being followed by Orthopedics for suspected right shoulder hardware infection.  She underwent I and D of the right shoulder with placement of a drain in July 2012 and was discharge home on vancomycin via left-sided PICC.  She continued to develop pain and was taken to the hospital on 18-Oct-2010, where she underwent a right shoulder hemiarthroplasty removal with irrigation and debridement of the right shoulder and placement of a gentamicin- impregnated spacer.  Per conversation with Dr. Ranell Patrick, she looked well and was doing well postoperatively; however, through Saturday her urine output began to decrease and she was bolused 500 mL and was receiving 70 mL/hour of normal saline; however, despite this through Saturday she only produced about 50 mL of urine through the whole day.  Further complicating this, she started being getting a little more hypoxic with reported desats to 90% on 3 L and by the time Medicine was consulted chest x-ray was concerning for bilateral multifocal opacities concerning for pneumonia.  EKG was also done and was not concerning for any ischemic changes and is unchanged compared to prior per my  read, however, she also appears to be developing a rise in white count from 12- 14 and her sodium is declining as well from 123-120, and she is going into acute renal failure with a creatinine rising from 1.48 to 2.04, therefore Internal Medicine was consulted.  In discussion with the patient, she feels general weakness that is nonfocal.  She also complains of some nausea and some regurgitation of whatever she tries to take down.  She is having abdominal pain in her bilateral upper quadrants across just underneath her ribs.  She denies any bilateral lower quadrant pain.  She has decreased appetite and she feels feverish.  Of note, she states that as early as last Tuesday, she was feeling generally well and was up and about doing her day-to-day activities, but then all of this malaise and feeling ill started on Wednesday just before she was admitted on Friday.  She does deny shortness of breath, chest pain, headaches, vision changes, swelling.  She denies any erythema or redness or tenderness in her PICC insertion site and states that it has been okay for the past 4 to 5 weeks with no signs of infection.  REVIEW OF SYSTEMS:  As above, otherwise negative.  PAST MEDICAL HISTORY: 1. Hypertension. 2.  Breast cancer status post mastectomy 35 years ago.  She also     received chemo and radiation. 3. Shoulder replacement in the 1990s complicated by right shoulder     arthroplasty in 2010 and revision arthroplasty in 2010.  Also     status post I and D of the deep right shoulder with cultures     showing MSSA. 4. Right knee synovectomy and scar debridement and I and D of the knee     on April 22, 2008. 5. Total right knee replacement in March 2009.  ALLERGIES:  No known drug allergies.  HOME MEDICATIONS:  Rifampin, Crestor, methocarbamol, Vicodin, multivitamin, ibuprofen, levothyroxine, Hyzaar, aspirin 81, and amlodipine 10.  MEDICINES RECEIVED IN THE HOSPITAL SINCE ADMISSION:   Amlodipine, aspirin, cefazolin, docusate, levothyroxine, methocarbamol, morphine, multivitamin, rifampin, Crestor, Tylenol, P.r.n. Benadryl, p.r.n. Zofran.  FAMILY HISTORY:  Noncontributory.  SOCIAL HISTORY:  She lives at [facility] with her husband and just moved about 2 weeks ago.  She has 6 children.  She has a sister and husband.  She is a never smoker.  She drinks occasional glass of wine at nighttime, but has not done that for the past several weeks.  PHYSICAL EXAMINATION:  VITAL SIGNS:  Temperature is 99.7 with T-max through her admission is 100.0 yesterday, blood pressure is 118/68, has been in the 110-120s, pulse 98, oxygen saturation was 93% on 2 L; however, when I took her off oxygen she desatted into the mid 80s. GENERAL:  She is an average-sized woman lying in her hospital bed.  She appears flush and her cheeks are red.  She is conversant and alert and oriented.  She appears mildly uncomfortable, but is not in any acute distress. HEENT:  Pupils are equal, round, and reactive.  Anicteric.  Her sclerae were fairly clear.  Her mouth was very dry appearing on her mucous membranes. LUNGS:  Had minimal crackles at her bilateral bases, but were not grossly wet sounding, but her air movement was not great. HEART:  Regular rate and rhythm with a very soft systolic murmur. ABDOMEN:  Distended, but not tight or tense.  She was tender in her bilateral upper quadrants and her epigastrium, but she did not exhibit gross peritoneal signs. EXTREMITIES:  Her bilateral lower extremities were warm and without any edema.  They appeared to be well perfused.  She had pneumatic compression devices on.  Examination of her right shoulder surgery site appeared well.  We examined this with Dr. Ranell Patrick and it did not appear erythematosus or purulent or tender. NEUROLOGIC:  She was intact.  She was alert, oriented, and conversant. She was moving all her extremities spontaneously  with no gross focal neurological deficits noted.  LABORATORY DATA:  Significant for a white count on arrival of 12.4 with greater than 20% bands.  Her white count is now rising to 14.4, her hematocrit is low at 27.4, but stable, platelets are normal.  Her ESR is high at 50.  Her sodium is declining from 123-120, potassium is 3.6, chloride is low as well at 86.  BUN and creatinine are 23 and 2.04.  Her bicarb is 25.  Glucose is 154.  Abdominal x-ray shows a benign bowel gas pattern.  Chest x-ray was concerning for patchy bilateral pulmonary infiltrates ? pneumonia.  EKG was reviewed by me and appears nonischemic.  ASSESSMENT/PLAN:  This is an 77 year old female status post right shoulder hemiarthroplasty with deep implant removal, irrigation and debridement of right shoulder, and placement of  a gentamicin spacer on 10/18/10, who now presents with hypoxia and bilateral pulmonary infiltrates, anuria/acute renal failure, abdominal pain and distention, leukocytosis with increased band forms, hyponatremia. 1. Hypoxia.  She was saturating in the low 90s on 2 L, but desats to     the 80s when taken off room air.  This in combination with     multifocal infiltrates is concerning for either healthcare-     associated pneumonia versus developing ARDS.  It does not look     quite like edema to me and I think at this point she is more dry     than wet, therefore we will broaden her cefazolin out to Zosyn and     given that she was growing out MSSA from her shoulder, I think the     suspicion for MRSA is lower and therefore we will hold off on     vancomycin for now; however, if she continues to decompensate, I     would add on vancomycin. 2. Leukocytosis with bandemia.  Clinically, she looks infected.  We     will broaden her out to Zosyn at this point with a low threshold to     broaden her further with vancomycin.  I have asked for blood     cultures x2, one from the PICC and one from  the peripheral and also     UA and new culture to evaluate for sources of infection, although     her belly does appear minimally distended and she may have a little     bit of a postop ileus versus involving bowel obstruction.  This may     be the source of her fevers as well.  I would have a low threshold     to scan her belly with IV and p.o. contrast should she continue to     further decompensate as well; however, given the multifocal     opacities being a more likely source of infection, we will hold off     on this for now. 3. Anuria/acute renal failure.  I have asked them to bolus her 1 L and     gave her 125 per hour for another 500 mL over the next couple hours     and also get a urine lytes and urine also so we can calculate out     her FENA, although clinically she looks dry and she is also     hyponatremic with hypochloremia and hopefully her kidney function     will pick up with more IV fluids; however, we would be cautious to     make sure that she does not have pulmonary edema.  I have asked     them to also hold losartan and hydrochlorothiazide until her kidney     function improves. 4. Hyponatremia.  Again I suspect that this is likely prerenal,     however, in the differential given her postoperative state and a     potential long process it will be an SIADH picture, but at this     point we will trend out her electrolytes after the administration     of normal saline. 5. Fluid, electrolytes, and nutrition.  We will give her IV fluids as     above.  She should have clear liquids for now and I will be     cautious with her in advancing her diet for now.  IV access, she  has a left-sided PICC in place. 6. Code status is presumed full, although this should be clarified.          ______________________________ Carlota Raspberry, MD     EB/MEDQ  D:  09/22/2010  T:  09/22/2010  Job:  [id number]  Electronically Signed by Carlota Raspberry MD on 11/06/2010 12:04:01 PM

## 2010-12-14 IMAGING — CT CT EXTREM UP W/ CM*L*
2 of 3 series · 7 of 14 positions shown, 8 images · IV contrast (agent unspecified)
Comparison: None

CLINICAL DATA: Right shoulder pain.  Status post total shoulder
arthroplasty.

CT OF THE RIGHT SHOULDER WITH INTRA-ARTICULAR CONTRAST
TECHNIQUE: Multidetector CT imaging of the right shoulder was
performed according to the standard protocol following intra-
articular contrast administration. Multiplanar CT image
reconstructions were also generated.
Contrast: 4 ml of Rudi 60 and diluted with 8 ml 1% lidocaine

[Series 4: shoulder/standard · axial · 0.47mm/px · z∈[-146,-68]mm · 4 of 104 slices shown]
[im 21/104  soft-tissue]
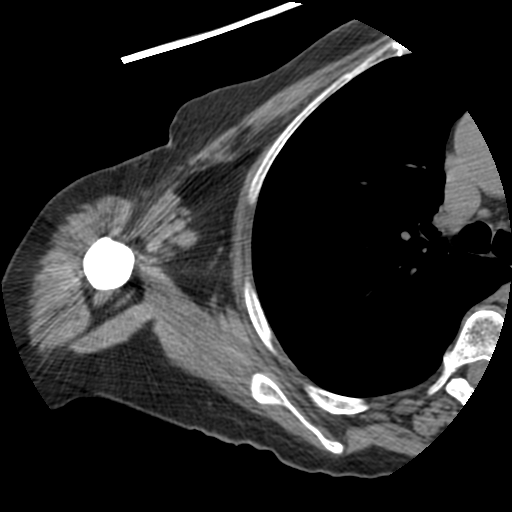
[im 42/104  soft-tissue]
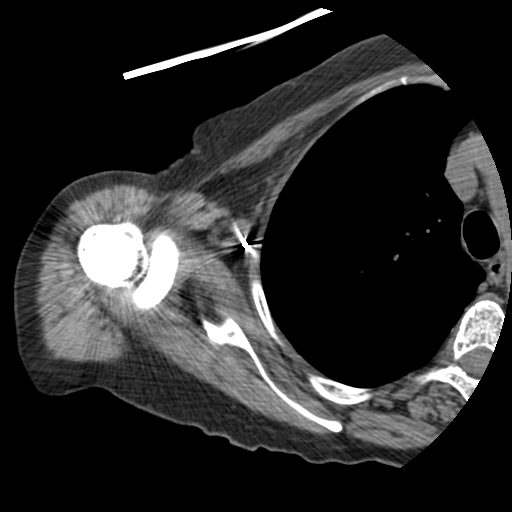
[im 62/104  soft-tissue]
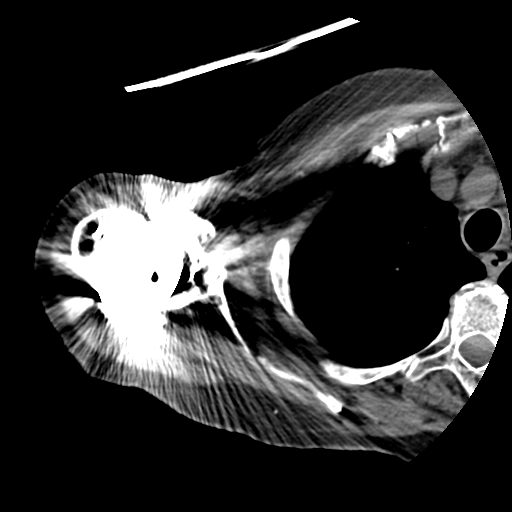
[im 83/104  soft-tissue]
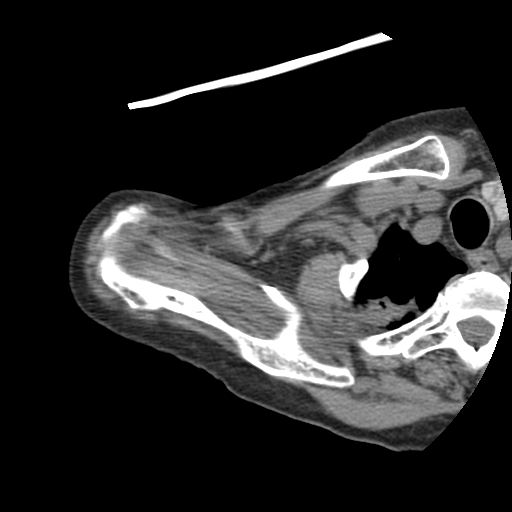

[Series 501: sagittal · sagittal · 0.47mm/px · 3 of 78 slices shown, 4 images]
[im 20/78  soft-tissue]
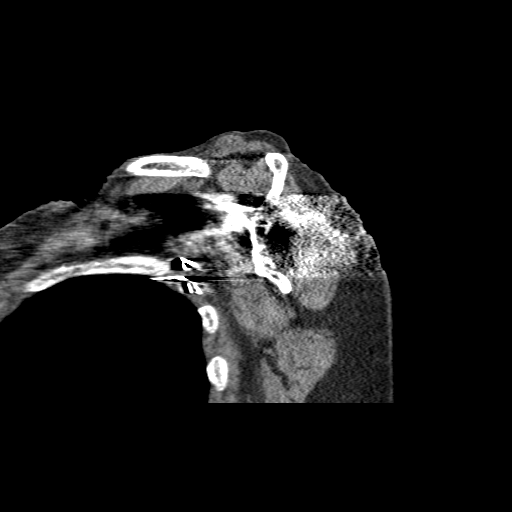
[im 20/78  bone]
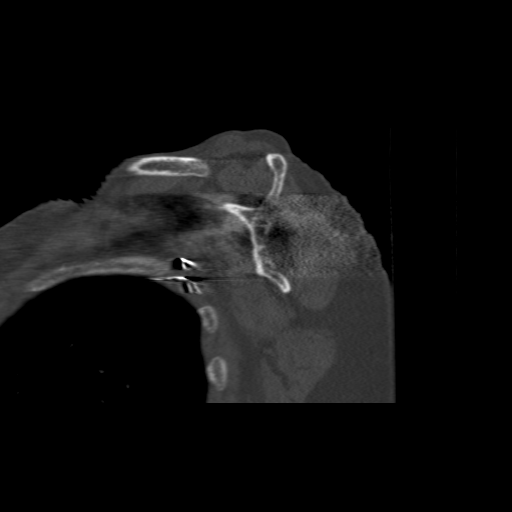
[im 39/78  bone]
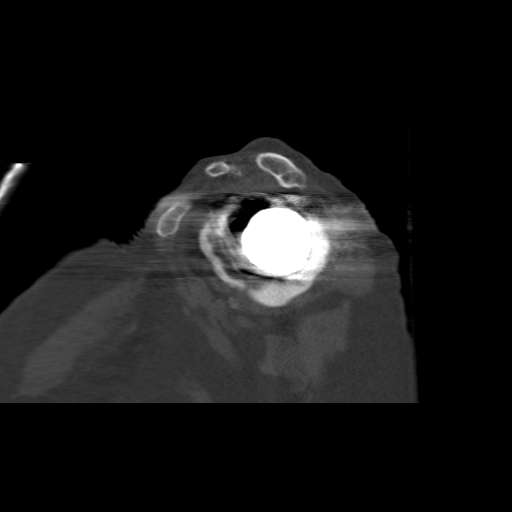
[im 58/78  bone]
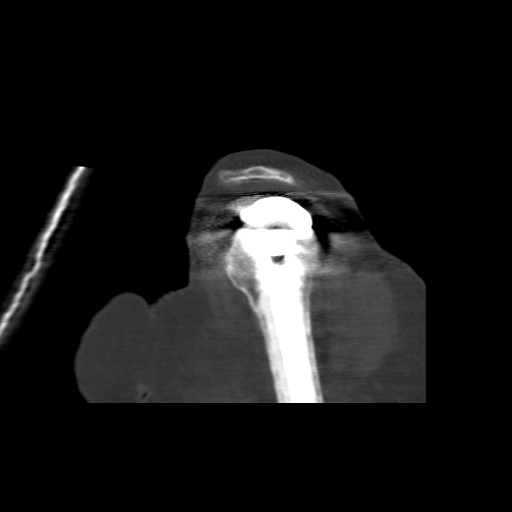

[7 of 14 positions shown; findings below may reference images not displayed]

FINDINGS: The scan demonstrates that the contrast is retained
within the joint capsule.  There is no evidence of a rotator cuff
tear.  There is no evidence of loosening of the humeral component
of the prosthesis.

There is what appears to be contrast extending around the glenoid
component of the prosthesis.  This is especially apparent on the
axial images.  This is consistent with loosening of the glenoid
component.

Note is made of irregular apical pleural thickening in the right
lung apex.  There are some calcifications associated with this.
The appearance is similar to that seen on a chest x-ray of
05/03/2007.

The patient has had a right mastectomy.
IMPRESSION: There appears to be undermining and loosening of the glenoid
component of the right total shoulder replacement.  Contrast
extends deep to the glenoid component.

The rotator cuff is intact.

## 2013-05-28 IMAGING — CR DG CHEST 2V
2 series · 2 of 2 positions shown · non-contrast
Comparison: 05/15/2008

CLINICAL DATA: Preoperative respiratory exam.  Right shoulder
infection.

CHEST - 2 VIEW

[view not recorded (1 of 2)]
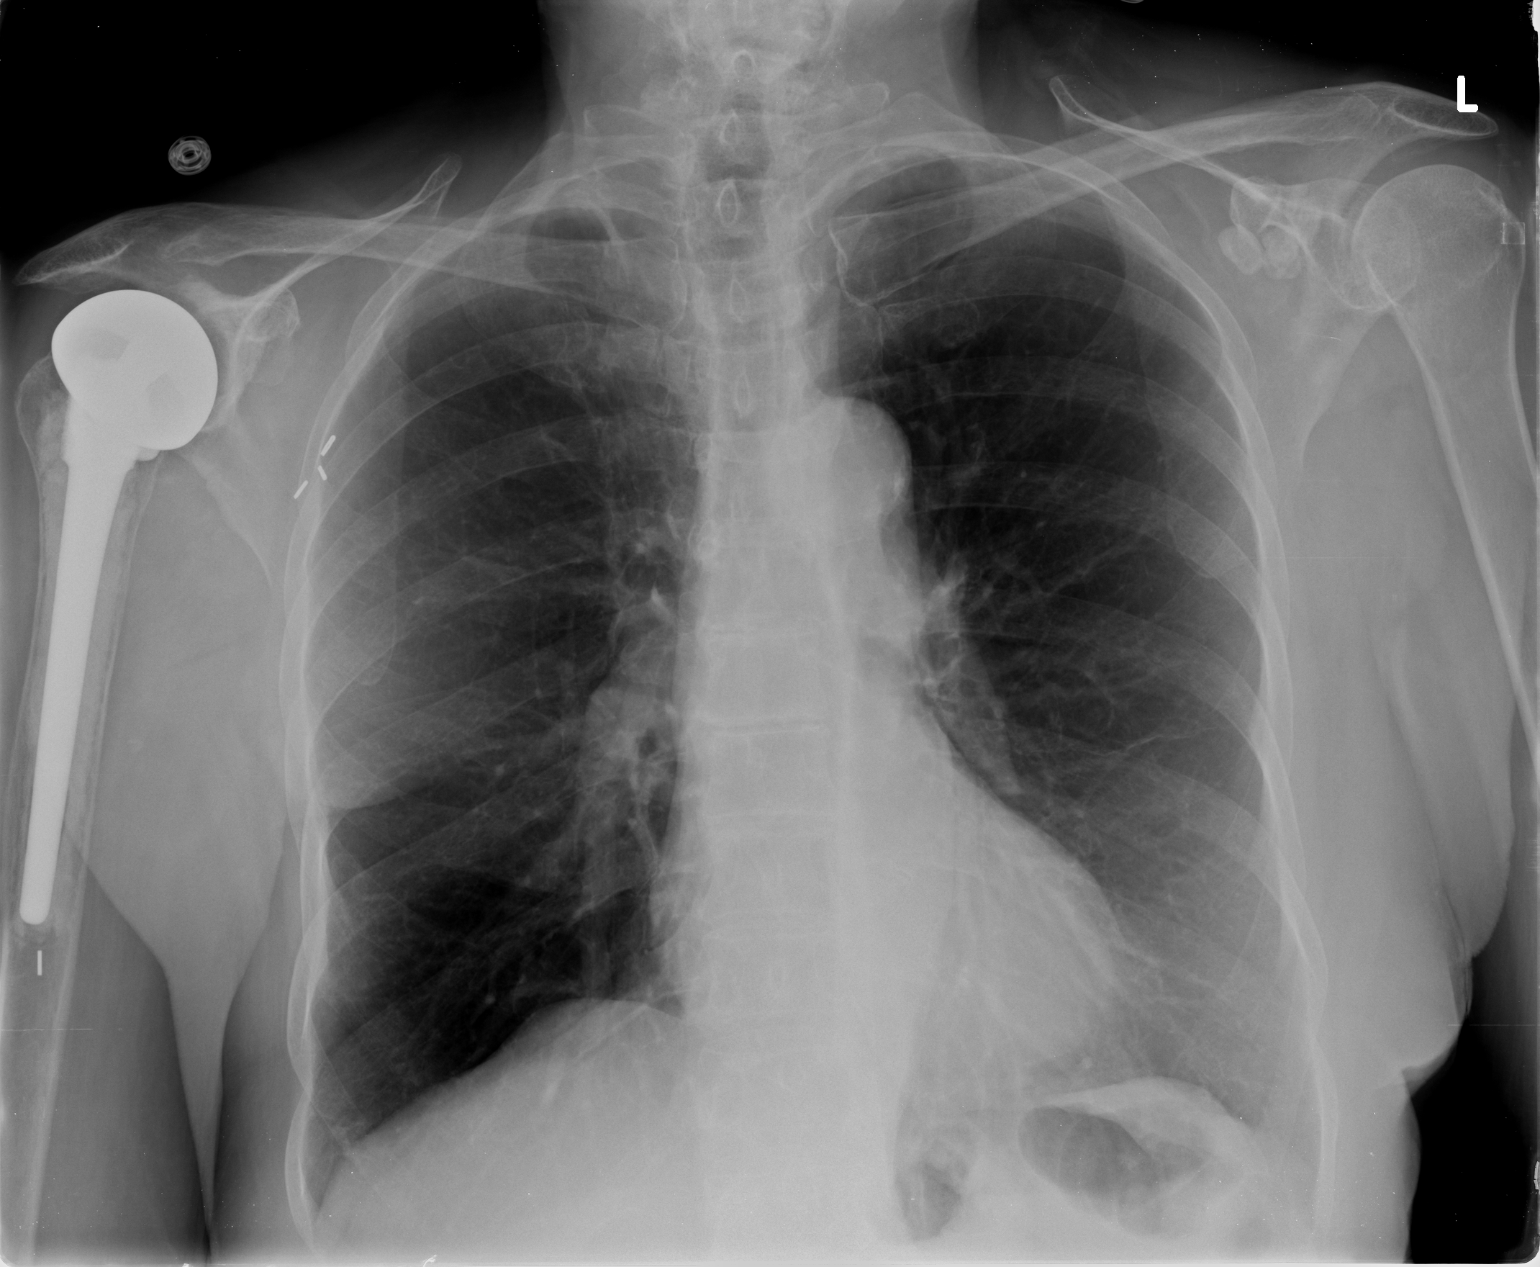

[view not recorded (2 of 2)]
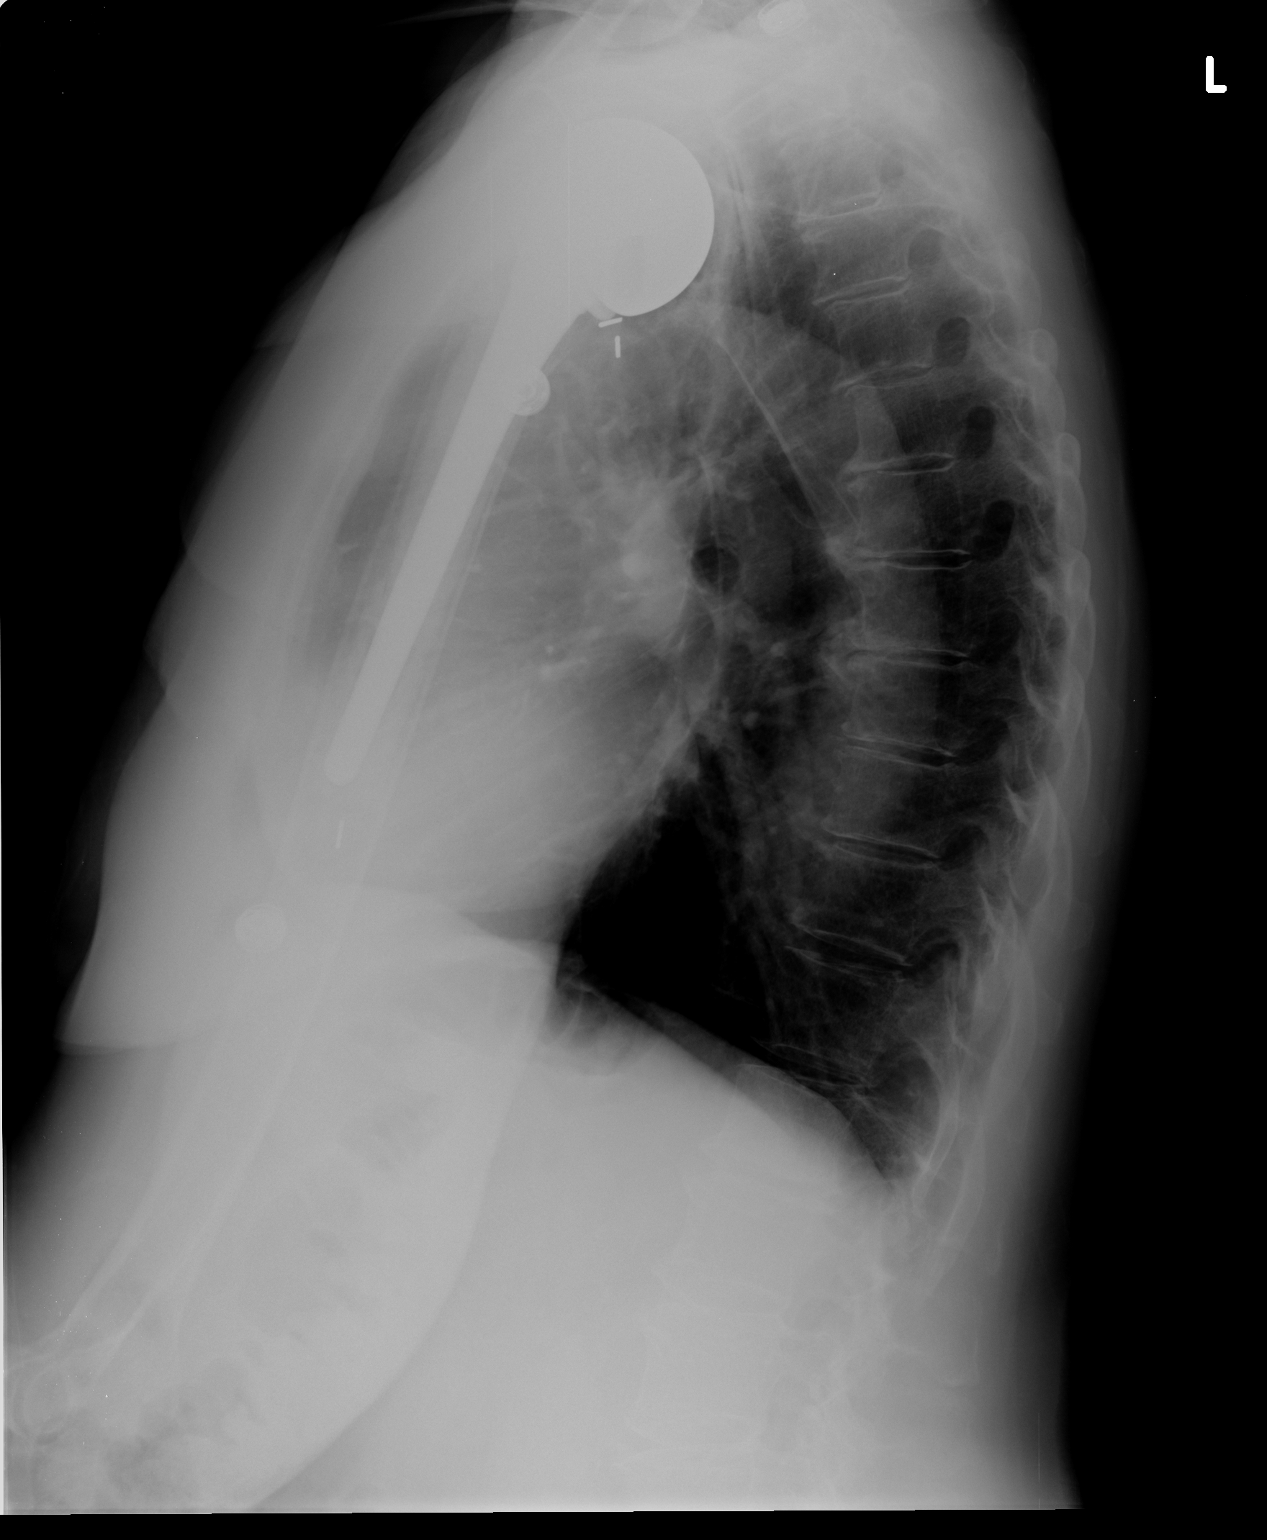

[2 of 2 positions shown; findings below may reference images not displayed]

FINDINGS: Heart and lungs appear normal.  Right proximal humeral
prosthesis is noted.  Ossified loose bodies are present in the left
shoulder joint.  No acute osseous abnormalities.
IMPRESSION: No acute disease in the chest.

## 2013-05-29 IMAGING — CR DG CHEST 1V PORT
1 series · 1 of 1 positions shown · non-contrast
Comparison: 08/13/2010.

CLINICAL DATA: PICC placement.

PORTABLE CHEST - 1 VIEW

[view not recorded]
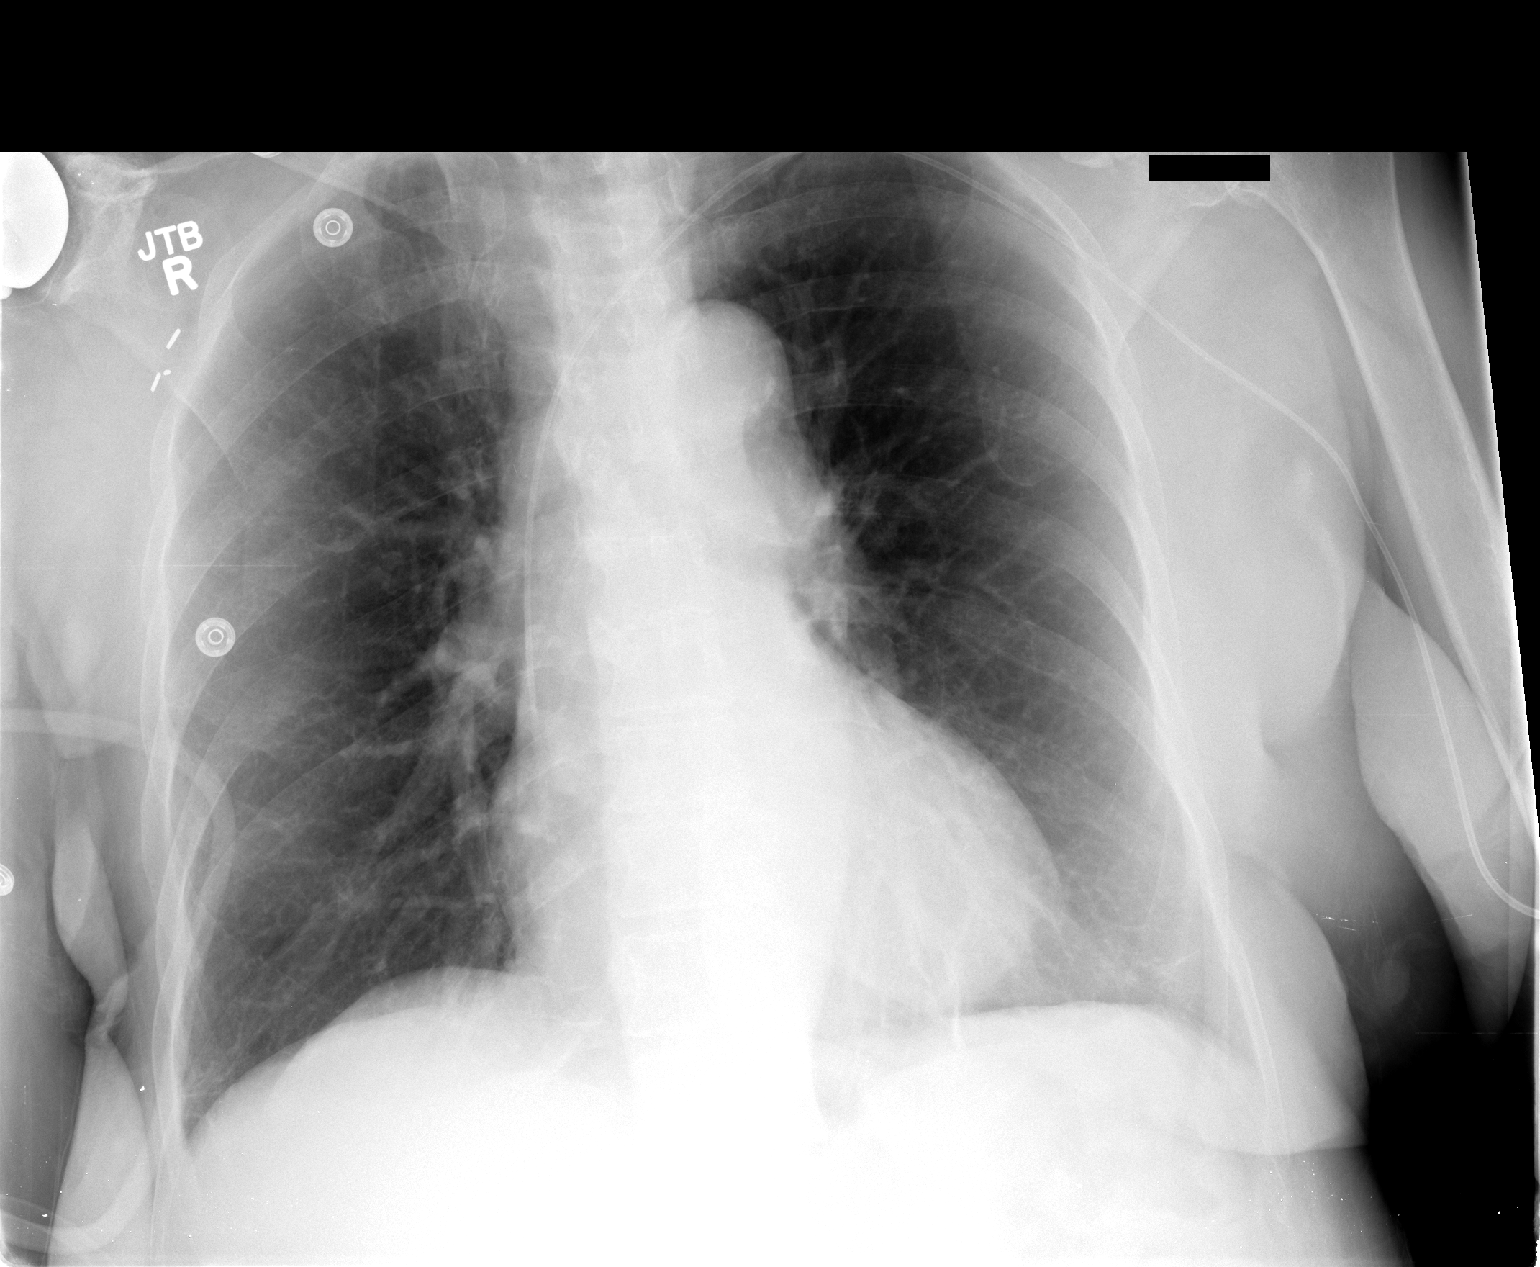

[1 of 1 positions shown; findings below may reference images not displayed]

FINDINGS: Tip of left PICC terminates in lower superior vena cava
at cavoatrial junction.  No pneumothorax.  No pleural effusion.
Heart upper normal size. Ectasia and nonaneurysmal calcification of
the thoracic aorta are seen.  Surgical staples over right scapula.
Osteopenic appearance of bones.
IMPRESSION: Tip of left PICC terminates in lower superior vena cava at
cavoatrial junction.  No pneumothorax.  No pleural effusion.
